# Patient Record
Sex: Male | Born: 1995
Health system: Southern US, Community
[De-identification: ages and names within clinical notes are randomized; demographics above are authoritative.]

## PROBLEM LIST (undated history)

## (undated) DIAGNOSIS — I10 Essential (primary) hypertension: Secondary | ICD-10-CM

## (undated) DIAGNOSIS — E079 Disorder of thyroid, unspecified: Secondary | ICD-10-CM

## (undated) HISTORY — DX: Essential (primary) hypertension: I10

## (undated) HISTORY — DX: Disorder of thyroid, unspecified: E07.9

---

## 2013-08-09 ENCOUNTER — Ambulatory Visit (INDEPENDENT_AMBULATORY_CARE_PROVIDER_SITE_OTHER): Payer: 59 | Admitting: Physician Assistant

## 2013-08-09 ENCOUNTER — Ambulatory Visit (INDEPENDENT_AMBULATORY_CARE_PROVIDER_SITE_OTHER): Payer: 59

## 2013-08-09 ENCOUNTER — Encounter: Payer: Self-pay | Admitting: Physician Assistant

## 2013-08-09 VITALS — BP 134/78 | HR 87 | Temp 97.9°F | Ht 68.0 in | Wt 168.0 lb

## 2013-08-09 DIAGNOSIS — Z720 Tobacco use: Secondary | ICD-10-CM | POA: Insufficient documentation

## 2013-08-09 DIAGNOSIS — R112 Nausea with vomiting, unspecified: Secondary | ICD-10-CM

## 2013-08-09 DIAGNOSIS — R109 Unspecified abdominal pain: Secondary | ICD-10-CM

## 2013-08-09 DIAGNOSIS — E039 Hypothyroidism, unspecified: Secondary | ICD-10-CM

## 2013-08-09 DIAGNOSIS — F17200 Nicotine dependence, unspecified, uncomplicated: Secondary | ICD-10-CM

## 2013-08-09 DIAGNOSIS — R1011 Right upper quadrant pain: Secondary | ICD-10-CM

## 2013-08-09 MED ORDER — VARENICLINE TARTRATE 0.5 MG X 11 & 1 MG X 42 PO MISC: ORAL | Status: DC

## 2013-08-09 MED ORDER — ONDANSETRON HCL 8 MG PO TABS: 8.0000 mg | ORAL_TABLET | Freq: Three times a day (TID) | ORAL | Status: DC | PRN

## 2013-08-09 NOTE — Progress Notes (Signed)
Subjective:    Patient ID: Devin Kim, male    DOB: [birth year], 18 y.o.   MRN: [id number]  HPI Patient is a 18 year old male who presents to clinic with ongoing nausea and vomiting. He wishes to establish care today. Patient has had 2 full weeks of vomiting after eating meals. He does not have any appetite. He describes some pressure in his upper abdomen but no sharp pains. She vomits mostly after meals but nauseous continually. His bowel movements have not really changed but if anything become cold but looser. He denies any blood in his stools and has bowel movements 2-3 times a day. He tried his sister Phenergan and it not found out. For the past couple of days he has gotten a little dizzy and lightheaded especially when trying to work out. Symptoms are definitely worse with greasy or foods. He denies any feelings of acid reflux. He does smoke daily and has a history of hypothyroidism. He has quit smoking wants him Chantix and would like to do it again. He denies any fever, chills, dysuria, back pain. He has not tried anything to make better. He has reported a 10 pound weight loss in the last month.  . Active Ambulatory Problems    Diagnosis Date Noted  . Hypothyroidism 08/09/2013  . Tobacco abuse 08/09/2013   Resolved Ambulatory Problems    Diagnosis Date Noted  . No Resolved Ambulatory Problems   Past Medical History  Diagnosis Date  . Thyroid disease   . Hypertension    . Family History  Problem Relation Age of Onset  . Alcohol abuse Father   . Hypertension Father   . Diabetes Paternal Uncle   . Cancer Paternal Grandmother   . Hypertension Paternal Grandfather    . History   Social History  . Marital Status: Unknown    Spouse Name: N/A    Number of Children: N/A  . Years of Education: N/A   Occupational History  . Not on file.   Social History Main Topics  . Smoking status: Current Every Day Smoker  . Smokeless tobacco: Not on file  . Alcohol Use: No  . Drug  Use: No  . Sexual Activity: Not Currently   Other Topics Concern  . Not on file   Social History Narrative  . No narrative on file      Review of Systems  All other systems reviewed and are negative.       Objective:   Physical Exam  Constitutional: He is oriented to person, place, and time. He appears well-developed and well-nourished.  HENT:  Head: Normocephalic and atraumatic.  Right Ear: External ear normal.  Left Ear: External ear normal.  Nose: Nose normal.  Mouth/Throat: Oropharynx is clear and moist.  Eyes: Conjunctivae are normal. Right eye exhibits no discharge. Left eye exhibits no discharge.  Neck: Normal range of motion. Neck supple.  Cardiovascular: Normal rate, regular rhythm and normal heart sounds.   Pulmonary/Chest: Effort normal and breath sounds normal. He has no wheezes.  Abdominal: Soft. Bowel sounds are normal. He exhibits no mass. There is no rebound and no guarding.  Tenderness over epigastric, left upper quadrant and right upper quadrant. Pain was more reproducible over right upper quadrant.  Lymphadenopathy:    He has no cervical adenopathy.  Neurological: He is alert and oriented to person, place, and time.  Skin: Skin is dry.  Psychiatric: He has a normal mood and affect. His behavior is normal.  Assessment & Plan:  Nausea/vomiting/right upper quadrant pain-symptoms sound very suspicious of gallbladder disease. Will get abdominal ultrasound today to rule out any gallbladder function. Discussed with patient that even if normal could still be some function loss of gallbladder and may need to do HIDA scan. I also gave patient excellent 60 mg to start daily. I would like to know of or samples run out if symptoms have improved. Patient was given Zofran to take for nausea. Discussed with patient 1118 fatty greasy foods and see if helps with overall nausea. Will also order CBC to check white count and CMP to check electrolytes.    Hypothyroidism-patient's TSH levels have not been checked in a while will recheck today.  Tobacco abuse-patient reports that Chantix has worked in the past and will give Rx for Chantix to start once he starts feeling better. Patient was encouraged to stop smoking is one of the best things he can do for his health.

## 2013-08-10 LAB — CBC WITH DIFFERENTIAL/PLATELET
Basophils Absolute: 0 10*3/uL (ref 0.0–0.1)
Basophils Relative: 1 % (ref 0–1)
Eosinophils Absolute: 0.2 10*3/uL (ref 0.0–0.7)
Eosinophils Relative: 3 % (ref 0–5)
HEMATOCRIT: 44.7 % (ref 39.0–52.0)
Hemoglobin: 14.8 g/dL (ref 13.0–17.0)
LYMPHS ABS: 2 10*3/uL (ref 0.7–4.0)
Lymphocytes Relative: 25 % (ref 12–46)
MCH: 28.6 pg (ref 26.0–34.0)
MCHC: 33.1 g/dL (ref 30.0–36.0)
MCV: 86.3 fL (ref 78.0–100.0)
MONO ABS: 0.7 10*3/uL (ref 0.1–1.0)
Monocytes Relative: 8 % (ref 3–12)
NEUTROS ABS: 5.1 10*3/uL (ref 1.7–7.7)
Neutrophils Relative %: 63 % (ref 43–77)
PLATELETS: 237 10*3/uL (ref 150–400)
RBC: 5.18 MIL/uL (ref 4.22–5.81)
RDW: 13.1 % (ref 11.5–15.5)
WBC: 8.1 10*3/uL (ref 4.0–10.5)

## 2013-08-10 LAB — COMPLETE METABOLIC PANEL WITH GFR
ALBUMIN: 4.7 g/dL (ref 3.5–5.2)
ALT: 25 U/L (ref 0–53)
AST: 22 U/L (ref 0–37)
Alkaline Phosphatase: 43 U/L (ref 39–117)
BUN: 13 mg/dL (ref 6–23)
CALCIUM: 9.7 mg/dL (ref 8.4–10.5)
CHLORIDE: 99 meq/L (ref 96–112)
CO2: 33 mEq/L — ABNORMAL HIGH (ref 19–32)
Creat: 0.9 mg/dL (ref 0.50–1.35)
GFR, Est African American: >89 mL/min
GFR, Est Non African American: >89 mL/min
Glucose, Bld: 81 mg/dL (ref 70–99)
POTASSIUM: 4.4 meq/L (ref 3.5–5.3)
Sodium: 144 mEq/L (ref 135–145)
Total Bilirubin: 0.4 mg/dL (ref 0.2–1.2)
Total Protein: 6.5 g/dL (ref 6.0–8.3)

## 2013-08-10 LAB — TSH: TSH: 4.707 u[IU]/mL — ABNORMAL HIGH (ref 0.350–4.500)

## 2013-08-12 ENCOUNTER — Other Ambulatory Visit: Payer: Self-pay | Admitting: Physician Assistant

## 2013-08-12 DIAGNOSIS — R112 Nausea with vomiting, unspecified: Secondary | ICD-10-CM | POA: Insufficient documentation

## 2013-08-12 DIAGNOSIS — R1011 Right upper quadrant pain: Secondary | ICD-10-CM

## 2013-08-12 MED ORDER — LEVOTHYROXINE SODIUM 75 MCG PO TABS: 75.0000 ug | ORAL_TABLET | Freq: Every day | ORAL | Status: DC

## 2013-08-12 NOTE — Patient Instructions (Signed)
Will get RUQ ultrasound.  STart dexilant daily.

## 2013-08-20 ENCOUNTER — Telehealth: Payer: Self-pay | Admitting: *Deleted

## 2013-08-20 NOTE — Telephone Encounter (Signed)
PA obtained for NM Hepatobiliary. Auth # [id number].  Meyer CoryMisty Ahmad, LPN

## 2013-08-30 ENCOUNTER — Ambulatory Visit (INDEPENDENT_AMBULATORY_CARE_PROVIDER_SITE_OTHER): Payer: BC Managed Care – PPO | Admitting: Physician Assistant

## 2013-08-30 ENCOUNTER — Encounter: Payer: Self-pay | Admitting: Physician Assistant

## 2013-08-30 VITALS — BP 136/72 | HR 97 | Wt 167.0 lb

## 2013-08-30 DIAGNOSIS — R569 Unspecified convulsions: Secondary | ICD-10-CM

## 2013-08-30 DIAGNOSIS — K649 Unspecified hemorrhoids: Secondary | ICD-10-CM

## 2013-08-30 DIAGNOSIS — Z13220 Encounter for screening for lipoid disorders: Secondary | ICD-10-CM

## 2013-08-30 DIAGNOSIS — K921 Melena: Secondary | ICD-10-CM

## 2013-08-30 MED ORDER — VARENICLINE TARTRATE 0.5 MG X 11 & 1 MG X 42 PO MISC: ORAL | Status: DC

## 2013-08-30 MED ORDER — HYDROCORTISONE ACETATE 25 MG RE SUPP: 25.0000 mg | Freq: Two times a day (BID) | RECTAL | Status: DC

## 2013-08-30 NOTE — Progress Notes (Signed)
   Subjective:    Patient ID: Devin Kim, male    DOB: 11/06/1995, 18 y.o.   MRN: [id number]  HPI Patient is a 18 year old male who presents to the clinic with blood in stool for 2 weeks. He has had this issue before and were hemorrhoids. They seemed to resolve on their own. The last time he had hemorrhoids or from heavy lifting. He denies any heavy lifting but admits to straining with bowel movements. He has increased his water intake to help this. He reports a bowel movement every other day but they are harder than usual. Denies any abdominal pain or back pain. It is always bright red blood in stool not tarry black stools. Denies any fever, chills. Patient does have some ongoing nausea and vomiting and right upper quadrant tenderness. He does have the HIDA scan scheduled for next week.  Patient didn't want me to be aware that he did have a seizure approximately 3 weeks ago. His seizures are typically tonic-clonic. He has an appointment next week to be evaluated by neurologist Crystal rose PA. He has been placed out of work until he has his neurologist evaluation.  He never started Chantix before. His previous insurance company would not pay for Chantix. He would like another prescription today.    Review of Systems     Objective:   Physical Exam  Constitutional: He is oriented to person, place, and time. He appears well-developed and well-nourished.  HENT:  Head: Normocephalic and atraumatic.  Cardiovascular: Normal rate, regular rhythm and normal heart sounds.   Pulmonary/Chest: Effort normal and breath sounds normal.  Genitourinary: Rectum normal.  No external hemorrhoids were seen. I did palpate one to 2 small masses at the base of the anal sphincter just right inside. Hemoccult was negative for blood.  Neurological: He is alert and oriented to person, place, and time.  Skin: Skin is dry.  Psychiatric: He has a normal mood and affect. His behavior is normal.          Assessment  & Plan:  Hemorrhoid/blood in stool-discuss with patient that no external hemorrhoids were viewed. I did palpate what I feel like were internal hemorrhoids at the base of sphincter. Hemoccult in office today was negative for blood. Will treat with Anusol as needed for hemorrhoids. Discuss warm sitz bath. Encouraged patient to increase fiber in diet and consider stool softener. Gave patient handout on other symptomatic treatment of hemorrhoids. It feels like pain is not resolving or symptoms not resolving please followup.  Nausea vomiting/right upper quadrant pain- Will evaluate with HIDA scan   Tobacco cessation-patient was pronounced another copy of Chantix to see if this insurance company will pay.  Patient needs screening cholesterol. Lab slip was given.   Seizure-patient is being managed by Crystal rose PA/neurologist. Encouraged patient not to drive until evaluated by neurologist.

## 2013-08-30 NOTE — Patient Instructions (Addendum)
Colace-stool softener/high fiber diet  Hemorrhoids Hemorrhoids are swollen veins around the rectum or anus. There are two types of hemorrhoids:   Internal hemorrhoids. These occur in the veins just inside the rectum. They may poke through to the outside and become irritated and painful.  External hemorrhoids. These occur in the veins outside the anus and can be felt as a painful swelling or hard lump near the anus. CAUSES  Pregnancy.   Obesity.   Constipation or diarrhea.   Straining to have a bowel movement.   Sitting for long periods on the toilet.  Heavy lifting or other activity that caused you to strain.  Anal intercourse. SYMPTOMS   Pain.   Anal itching or irritation.   Rectal bleeding.   Fecal leakage.   Anal swelling.   One or more lumps around the anus.  DIAGNOSIS  Your caregiver may be able to diagnose hemorrhoids by visual examination. Other examinations or tests that may be performed include:   Examination of the rectal area with a gloved hand (digital rectal exam).   Examination of anal canal using a small tube (scope).   A blood test if you have lost a significant amount of blood.  A test to look inside the colon (sigmoidoscopy or colonoscopy). TREATMENT Most hemorrhoids can be treated at home. However, if symptoms do not seem to be getting better or if you have a lot of rectal bleeding, your caregiver may perform a procedure to help make the hemorrhoids get smaller or remove them completely. Possible treatments include:   Placing a rubber band at the base of the hemorrhoid to cut off the circulation (rubber band ligation).   Injecting a chemical to shrink the hemorrhoid (sclerotherapy).   Using a tool to burn the hemorrhoid (infrared light therapy).   Surgically removing the hemorrhoid (hemorrhoidectomy).   Stapling the hemorrhoid to block blood flow to the tissue (hemorrhoid stapling).  HOME CARE INSTRUCTIONS   Eat foods  with fiber, such as whole grains, beans, nuts, fruits, and vegetables. Ask your doctor about taking products with added fiber in them (fibersupplements).  Increase fluid intake. Drink enough water and fluids to keep your urine clear or pale yellow.   Exercise regularly.   Go to the bathroom when you have the urge to have a bowel movement. Do not wait.   Avoid straining to have bowel movements.   Keep the anal area dry and clean. Use wet toilet paper or moist towelettes after a bowel movement.   Medicated creams and suppositories may be used or applied as directed.   Only take over-the-counter or prescription medicines as directed by your caregiver.   Take warm sitz baths for 15 20 minutes, 3 4 times a day to ease pain and discomfort.   Place ice packs on the hemorrhoids if they are tender and swollen. Using ice packs between sitz baths may be helpful.   Put ice in a plastic bag.   Place a towel between your skin and the bag.   Leave the ice on for 15 20 minutes, 3 4 times a day.   Do not use a donut-shaped pillow or sit on the toilet for long periods. This increases blood pooling and pain.  SEEK MEDICAL CARE IF:  You have increasing pain and swelling that is not controlled by treatment or medicine.  You have uncontrolled bleeding.  You have difficulty or you are unable to have a bowel movement.  You have pain or inflammation outside the area of  the hemorrhoids. MAKE SURE YOU:  Understand these instructions.  Will watch your condition.  Will get help right away if you are not doing well or get worse. Document Released: 06/10/2000 Document Revised: 05/30/2012 Document Reviewed: 04/17/2012 [facility] LPExit[facility] Patient Information 2014 [place], [place].

## 2013-09-02 ENCOUNTER — Other Ambulatory Visit: Payer: Self-pay | Admitting: Physician Assistant

## 2013-09-02 ENCOUNTER — Encounter (HOSPITAL_COMMUNITY)
Admission: RE | Admit: 2013-09-02 | Discharge: 2013-09-02 | Disposition: A | Discharge status: Home / Self Care | Payer: BC Managed Care – PPO | Source: Ambulatory Visit | Attending: Physician Assistant | Admitting: Physician Assistant

## 2013-09-02 DIAGNOSIS — R1011 Right upper quadrant pain: Secondary | ICD-10-CM

## 2013-09-02 DIAGNOSIS — R112 Nausea with vomiting, unspecified: Secondary | ICD-10-CM

## 2013-09-02 MED ORDER — SINCALIDE 5 MCG IJ SOLR
0.0200 ug/kg | Freq: Once | INTRAMUSCULAR | Status: AC
  Administered 2013-09-02: 1.52 ug via INTRAVENOUS

## 2013-09-02 MED ORDER — STERILE WATER FOR INJECTION IJ SOLN
INTRAMUSCULAR | Status: AC
  Filled 2013-09-02: qty 10

## 2013-09-02 MED ORDER — TECHNETIUM TC 99M MEBROFENIN IV KIT
5.0000 | PACK | Freq: Once | INTRAVENOUS | Status: AC | PRN
  Administered 2013-09-02: 5 via INTRAVENOUS

## 2013-09-02 MED ORDER — SINCALIDE 5 MCG IJ SOLR
INTRAMUSCULAR | Status: AC
  Filled 2013-09-02: qty 5

## 2013-09-04 ENCOUNTER — Other Ambulatory Visit: Payer: Self-pay | Admitting: Physician Assistant

## 2013-09-04 DIAGNOSIS — R112 Nausea with vomiting, unspecified: Secondary | ICD-10-CM

## 2013-09-04 DIAGNOSIS — K649 Unspecified hemorrhoids: Secondary | ICD-10-CM

## 2013-09-04 DIAGNOSIS — K921 Melena: Secondary | ICD-10-CM

## 2013-10-21 ENCOUNTER — Encounter: Payer: Self-pay | Admitting: Physician Assistant

## 2013-10-21 DIAGNOSIS — K602 Anal fissure, unspecified: Secondary | ICD-10-CM | POA: Insufficient documentation

## 2013-10-30 ENCOUNTER — Telehealth: Payer: Self-pay | Admitting: *Deleted

## 2013-10-30 NOTE — Telephone Encounter (Signed)
Pt calls stating that he is having a ton of rectal pain.  He has been calling [facility] for 2 days & no one is calling him back. He states that his pain keeps getting worse & worse.  I advised him that he should probably go ahead and go to ED since you were are out of the office until Friday.  Just FYI.

## 2013-10-30 NOTE — Telephone Encounter (Signed)
Yes go to ED and needs follow up with digestive [facility].

## 2013-10-30 NOTE — Telephone Encounter (Signed)
Can call and see if they gave him in nitroglycerin cream to go rectally. I could send this in for him to try for muscle spasms of anus.

## 2013-11-04 ENCOUNTER — Telehealth: Payer: Self-pay | Admitting: *Deleted

## 2013-11-04 NOTE — Telephone Encounter (Signed)
Ok for continuing pak no refills. Needs follow up.

## 2013-11-04 NOTE — Telephone Encounter (Signed)
Spoke with pt & he is going to call me back with the name of the medication that GI had given him.  ED didn't do anything for him & told him to f/u with GI.

## 2013-11-04 NOTE — Telephone Encounter (Signed)
Pt would like to know if you could send over the continuing pack for chantix.

## 2013-11-05 MED ORDER — VARENICLINE TARTRATE 1 MG PO TABS: 1.0000 mg | ORAL_TABLET | Freq: Two times a day (BID) | ORAL | Status: DC

## 2013-11-05 NOTE — Telephone Encounter (Signed)
rx sent & LMOM notifying pt. 

## 2013-11-22 ENCOUNTER — Ambulatory Visit (INDEPENDENT_AMBULATORY_CARE_PROVIDER_SITE_OTHER): Payer: BC Managed Care – PPO | Admitting: Physician Assistant

## 2013-11-22 ENCOUNTER — Encounter: Payer: Self-pay | Admitting: Physician Assistant

## 2013-11-22 VITALS — BP 135/79 | HR 90 | Ht 68.0 in | Wt 167.0 lb

## 2013-11-22 DIAGNOSIS — F17200 Nicotine dependence, unspecified, uncomplicated: Secondary | ICD-10-CM

## 2013-11-22 DIAGNOSIS — Z7251 High risk heterosexual behavior: Secondary | ICD-10-CM

## 2013-11-22 DIAGNOSIS — R21 Rash and other nonspecific skin eruption: Secondary | ICD-10-CM

## 2013-11-22 DIAGNOSIS — J029 Acute pharyngitis, unspecified: Secondary | ICD-10-CM

## 2013-11-22 DIAGNOSIS — Z720 Tobacco use: Secondary | ICD-10-CM | POA: Insufficient documentation

## 2013-11-22 DIAGNOSIS — E039 Hypothyroidism, unspecified: Secondary | ICD-10-CM

## 2013-11-22 LAB — POCT RAPID STREP A (OFFICE): Rapid Strep A Screen: NEGATIVE

## 2013-11-22 MED ORDER — VARENICLINE TARTRATE 1 MG PO TABS: 1.0000 mg | ORAL_TABLET | Freq: Two times a day (BID) | ORAL | Status: DC

## 2013-11-22 MED ORDER — CLONAZEPAM 0.5 MG PO TABS: 0.5000 mg | ORAL_TABLET | Freq: Two times a day (BID) | ORAL | Status: DC | PRN

## 2013-11-23 LAB — HIV ANTIBODY (ROUTINE TESTING W REFLEX): HIV: NONREACTIVE

## 2013-11-23 LAB — CBC WITH DIFFERENTIAL/PLATELET
Basophils Absolute: 0 10*3/uL (ref 0.0–0.1)
Basophils Relative: 0 % (ref 0–1)
Eosinophils Absolute: 0.1 10*3/uL (ref 0.0–0.7)
Eosinophils Relative: 1 % (ref 0–5)
HCT: 41.6 % (ref 39.0–52.0)
HEMOGLOBIN: 14.5 g/dL (ref 13.0–17.0)
LYMPHS ABS: 1.6 10*3/uL (ref 0.7–4.0)
LYMPHS PCT: 21 % (ref 12–46)
MCH: 28.4 pg (ref 26.0–34.0)
MCHC: 34.9 g/dL (ref 30.0–36.0)
MCV: 81.6 fL (ref 78.0–100.0)
MONOS PCT: 11 % (ref 3–12)
Monocytes Absolute: 0.8 10*3/uL (ref 0.1–1.0)
Neutro Abs: 5 10*3/uL (ref 1.7–7.7)
Neutrophils Relative %: 67 % (ref 43–77)
PLATELETS: 227 10*3/uL (ref 150–400)
RBC: 5.1 MIL/uL (ref 4.22–5.81)
RDW: 13 % (ref 11.5–15.5)
WBC: 7.4 10*3/uL (ref 4.0–10.5)

## 2013-11-23 LAB — TSH: TSH: 5.745 u[IU]/mL — AB (ref 0.350–4.500)

## 2013-11-23 LAB — RPR TITER

## 2013-11-23 LAB — T4, FREE: Free T4: 1.37 ng/dL (ref 0.80–1.80)

## 2013-11-23 LAB — RPR: RPR: REACTIVE — AB

## 2013-11-25 ENCOUNTER — Other Ambulatory Visit: Payer: Self-pay | Admitting: Physician Assistant

## 2013-11-25 ENCOUNTER — Encounter: Payer: Self-pay | Admitting: Physician Assistant

## 2013-11-25 DIAGNOSIS — A539 Syphilis, unspecified: Secondary | ICD-10-CM | POA: Insufficient documentation

## 2013-11-25 LAB — HSV(HERPES SMPLX)ABS-I+II(IGG+IGM)-BLD
HSV 2 Glycoprotein G Ab, IgG: 0.26 IV
Herpes Simplex Vrs I&II-IgM Ab (EIA): 0.99 INDEX

## 2013-11-25 LAB — T.PALLIDUM AB, TOTAL

## 2013-11-25 MED ORDER — PREDNISONE 20 MG PO TABS: ORAL_TABLET | ORAL | Status: DC

## 2013-11-25 NOTE — Progress Notes (Addendum)
   Subjective:    Patient ID: Devin Kim, male    DOB: 1995-11-08, 18 y.o.   MRN: [id number]  HPI Pt  is a 18 yo male who presents to the clinic with a 2 week non-itchy, non-painful rash for 2 weeks. Noticed on his fore arms first and has spread to his legs. No fever, chills, n/v/d. He does wish to be STD tested. He has not been checked since being with his son's mother about 6 months ago. He has had at least one other sexual partner which was around 3 months ago. No penile lesions or discharge. he denies any fatigue. Does have scratchy throat.    Hypothyroidism- not checked in a while. Taking levothyroxine daily.   Tobacco abuse- stopped smoking since march 2015. On chantix needs refill.    Review of Systems  All other systems reviewed and are negative.      Objective:   Physical Exam  Constitutional: He is oriented to person, place, and time. He appears well-developed and well-nourished.  HENT:  Head: Normocephalic and atraumatic.  Right Ear: External ear normal.  Left Ear: External ear normal.  Nose: Nose normal.  Mouth/Throat: Oropharynx is clear and moist.  Eyes: Conjunctivae are normal. Right eye exhibits no discharge. Left eye exhibits no discharge.  Neck: Normal range of motion. Neck supple.  Cardiovascular: Normal rate, regular rhythm and normal heart sounds.   Pulmonary/Chest: Effort normal and breath sounds normal.  Lymphadenopathy:    He has no cervical adenopathy.  Neurological: He is oriented to person, place, and time.  Skin:  Macular erythematous rash on arms, trunk, legs. No ulceration, no vesicles.   Psychiatric: He has a normal mood and affect. His behavior is normal.          Assessment & Plan:  Rash/high risk sexual behavior- unclear etiology. Rapid strep negative. Will culture. No symptoms to treat. Will get CBC. Could be viral. Will get STD panel due to high risk and rash. Will call with results.   Tobacco abuse- using chantix. Refilled for another  month.   Hypothyroidism- will recheck labs and refill according.

## 2013-11-26 ENCOUNTER — Telehealth: Payer: Self-pay

## 2013-11-26 LAB — GC/CHLAMYDIA PROBE AMP, URINE
Chlamydia, Swab/Urine, PCR: NEGATIVE
GC Probe Amp, Urine: NEGATIVE

## 2013-11-26 NOTE — Telephone Encounter (Signed)
Due to positive RPR I scheduled patient for a nurse visit to receive his PCN injection. See result notes.

## 2013-11-27 ENCOUNTER — Ambulatory Visit (INDEPENDENT_AMBULATORY_CARE_PROVIDER_SITE_OTHER): Payer: BC Managed Care – PPO | Admitting: Physician Assistant

## 2013-11-27 VITALS — BP 138/78 | HR 76 | Temp 98.5°F

## 2013-11-27 DIAGNOSIS — A539 Syphilis, unspecified: Secondary | ICD-10-CM

## 2013-11-27 LAB — CULTURE, GROUP A STREP: ORGANISM ID, BACTERIA: NORMAL

## 2013-11-27 MED ORDER — PENICILLIN G BENZATHINE 1200000 UNIT/2ML IM SUSP
2.4000 10*6.[IU] | Freq: Once | INTRAMUSCULAR | Status: AC
  Administered 2013-11-27: 2.4 10*6.[IU] via INTRAMUSCULAR

## 2013-11-27 NOTE — Progress Notes (Signed)
Pt here to get Syphilis tx. He was given a total of 2.4 units of Bicillin L-A in two separate injections of 1.2/ .  The first was given in his LUOQ pt tolerated well. 2nd injection given without any problems also tolerated well. After injections were given pt was asked to wait for 15 mins after injections to watch for any reaction. Pt reports that Tamika from the health dept is coming to his home tomorrow [phone]).Deno Etienne

## 2013-12-04 ENCOUNTER — Ambulatory Visit: Payer: BC Managed Care – PPO | Admitting: *Deleted

## 2013-12-04 ENCOUNTER — Telehealth: Payer: Self-pay | Admitting: *Deleted

## 2013-12-04 NOTE — Telephone Encounter (Signed)
The patient was in the office today on the nurse visit schedule for an injection of penicillin since he tested positive for syphilis. Per Tamika who is familiar with his case and works at the health department, the infection should have cleared with just one injection. Tamika states the guidelines for treatment state  if a patient has had syphilis for a year or more(meaning they test positive without any signs or symptoms) then they would need three rounds of 2.4 units of Bicillin. Tamika states in this patients case one injection of the 2.4 units should clear his infection. He presented earlier in the office with a rash that had  only been present for two weeks.She states the patient would need to be recheck for syphilis in three months then again in 6 months. Advised patient of the guidelines and made him aware to mark his calender to recheck this in three months and again in 6 months

## 2014-07-31 IMAGING — NM NM HEPATO W/GB/PHARM/[PERSON_NAME]
2 series · 12 of 12 positions shown · non-contrast
Comparison: None.

CLINICAL DATA: Abdominal pain with nausea and vomiting

EXAM:
NUCLEAR MEDICINE HEPATOBILIARY IMAGING WITH GALLBLADDER EF
Views:  Anterior, right lateral right upper quadrant
Radionuclide: Technetium 99m Choletec
Dose:  5.0 mCi
Route of administration:  Intravenous

[he hepatobiliary · 3.43mm/px · 6 of 56 frames shown (1 of 2)]
[frame 5/56]
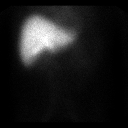
[frame 14/56]
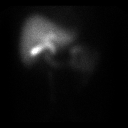
[frame 24/56]
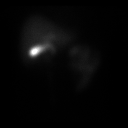
[frame 33/56]
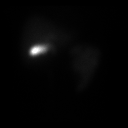
[frame 42/56]
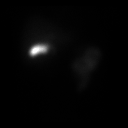
[frame 52/56]
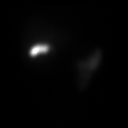

[he hepatobiliary · 3.43mm/px · 6 of 30 frames shown (2 of 2)]
[frame 3/30]
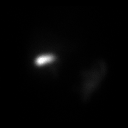
[frame 8/30]
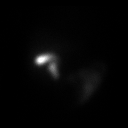
[frame 13/30]
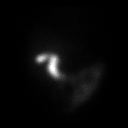
[frame 18/30]
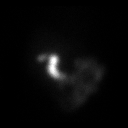
[frame 23/30]
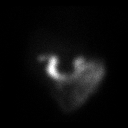
[frame 28/30]
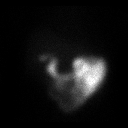

[12 of 12 positions shown; findings below may reference images not displayed]

FINDINGS: Liver uptake of radiotracer is normal. There is prompt visualization
of gallbladder and small bowel, indicating patency of the cystic and
common bile ducts.

A weight based dose, 1.5 mcg, of CCK was administered intravenously
with calculation of the computer generated ejection fraction of
radiotracer from the gallbladder. The patient did experience pain
during the CCK infusion. The computer generated ejection fraction of
radiotracer from the gallbladder is normal at 96.4%, normal greater
than 38%.
IMPRESSION: Normal ejection fraction of radiotracer from the gallbladder. Note
that the patient did experience pain during the CCK infusion. Cystic
and common bile ducts are patent as is evidenced by visualization of
gallbladder and small bowel.

## 2014-08-27 ENCOUNTER — Ambulatory Visit (INDEPENDENT_AMBULATORY_CARE_PROVIDER_SITE_OTHER): Payer: BLUE CROSS/BLUE SHIELD | Admitting: Physician Assistant

## 2014-08-27 ENCOUNTER — Encounter: Payer: Self-pay | Admitting: Physician Assistant

## 2014-08-27 VITALS — BP 145/79 | HR 98 | Wt 176.0 lb

## 2014-08-27 DIAGNOSIS — F411 Generalized anxiety disorder: Secondary | ICD-10-CM

## 2014-08-27 DIAGNOSIS — E039 Hypothyroidism, unspecified: Secondary | ICD-10-CM

## 2014-08-27 DIAGNOSIS — Z7251 High risk heterosexual behavior: Secondary | ICD-10-CM

## 2014-08-27 DIAGNOSIS — I10 Essential (primary) hypertension: Secondary | ICD-10-CM | POA: Insufficient documentation

## 2014-08-27 MED ORDER — LISINOPRIL 10 MG PO TABS: 10.0000 mg | ORAL_TABLET | Freq: Every day | ORAL | Status: DC

## 2014-08-27 MED ORDER — LEVOTHYROXINE SODIUM 75 MCG PO TABS: 75.0000 ug | ORAL_TABLET | Freq: Every day | ORAL | Status: DC

## 2014-08-27 MED ORDER — CITALOPRAM HYDROBROMIDE 10 MG PO TABS: 10.0000 mg | ORAL_TABLET | Freq: Every day | ORAL | Status: DC

## 2014-08-27 MED ORDER — CLONAZEPAM 0.5 MG PO TABS: 0.5000 mg | ORAL_TABLET | Freq: Two times a day (BID) | ORAL | Status: DC | PRN

## 2014-08-27 NOTE — Progress Notes (Signed)
   Subjective:    Patient ID: Devin Kim, male    DOB: 03/24/1996, 19 y.o.   MRN: [id number]  HPI Pt presents to the clinic to restart medication. He has been out of insurance for a while and needs to restart.   He does feel like anxiety is out of control. He has a good job but constiantly feels anxious and like it is effecting his performance. Not on anything. klonapin has helped in past with anxiety.   Not on any of his normal medications.   Hx of STD wants to make sure clean today. No symptoms.    Review of Systems  All other systems reviewed and are negative.      Objective:   Physical Exam  Constitutional: He is oriented to person, place, and time. He appears well-developed and well-nourished.  HENT:  Head: Normocephalic and atraumatic.  Cardiovascular: Normal rate, regular rhythm and normal heart sounds.   Pulmonary/Chest: Effort normal and breath sounds normal.  Neurological: He is alert and oriented to person, place, and time.  Skin: Skin is dry.  Psychiatric: He has a normal mood and affect. His behavior is normal.          Assessment & Plan:  GAD- GAD-7 was 12. Could have something to do with hypothyroidism out of control. Will start celexa 10mg  at bedtime. klonapin only as needed. Discussed abuse potential. Side effects discussed. Follow up in 4-6 weeks.   Hypothyroidism- start back on levothyroxine and recheck in 4-6 weeks.   HTN- elevated start on lisinopril 10mg  daily. Discussed side effects. Recheck in 4 weeks.   High risk sexual behavior/hx of sphyillis- STD panel done today.

## 2014-08-28 LAB — GC/CHLAMYDIA PROBE AMP, URINE
Chlamydia, Swab/Urine, PCR: NEGATIVE
GC Probe Amp, Urine: NEGATIVE

## 2014-08-28 LAB — RPR

## 2014-08-28 LAB — HIV ANTIBODY (ROUTINE TESTING W REFLEX): HIV: NONREACTIVE

## 2015-08-05 ENCOUNTER — Other Ambulatory Visit: Payer: Self-pay | Admitting: Physician Assistant

## 2015-08-06 NOTE — Telephone Encounter (Signed)
I called patient to schedule a f/u appt Monday 08-10-15 for med refills with Lesly Rubenstein

## 2015-08-10 ENCOUNTER — Ambulatory Visit (INDEPENDENT_AMBULATORY_CARE_PROVIDER_SITE_OTHER): Payer: BLUE CROSS/BLUE SHIELD | Admitting: Physician Assistant

## 2015-08-10 ENCOUNTER — Encounter: Payer: Self-pay | Admitting: Physician Assistant

## 2015-08-10 ENCOUNTER — Other Ambulatory Visit: Payer: Self-pay | Admitting: Physician Assistant

## 2015-08-10 VITALS — BP 145/53 | HR 75 | Ht 68.0 in | Wt 198.0 lb

## 2015-08-10 DIAGNOSIS — E039 Hypothyroidism, unspecified: Secondary | ICD-10-CM | POA: Diagnosis not present

## 2015-08-10 DIAGNOSIS — Z13220 Encounter for screening for lipoid disorders: Secondary | ICD-10-CM

## 2015-08-10 DIAGNOSIS — J029 Acute pharyngitis, unspecified: Secondary | ICD-10-CM

## 2015-08-10 DIAGNOSIS — Z113 Encounter for screening for infections with a predominantly sexual mode of transmission: Secondary | ICD-10-CM | POA: Diagnosis not present

## 2015-08-10 DIAGNOSIS — I10 Essential (primary) hypertension: Secondary | ICD-10-CM

## 2015-08-10 DIAGNOSIS — Z79899 Other long term (current) drug therapy: Secondary | ICD-10-CM

## 2015-08-10 DIAGNOSIS — F17201 Nicotine dependence, unspecified, in remission: Secondary | ICD-10-CM

## 2015-08-10 DIAGNOSIS — F411 Generalized anxiety disorder: Secondary | ICD-10-CM

## 2015-08-10 MED ORDER — CITALOPRAM HYDROBROMIDE 10 MG PO TABS: 10.0000 mg | ORAL_TABLET | Freq: Every day | ORAL | Status: DC

## 2015-08-10 MED ORDER — LISINOPRIL 10 MG PO TABS: 10.0000 mg | ORAL_TABLET | Freq: Every day | ORAL | Status: DC

## 2015-08-10 MED ORDER — CLONAZEPAM 0.5 MG PO TABS: 0.5000 mg | ORAL_TABLET | Freq: Two times a day (BID) | ORAL | Status: DC | PRN

## 2015-08-10 NOTE — Patient Instructions (Signed)

## 2015-08-10 NOTE — Progress Notes (Addendum)
   Subjective:    Patient ID: Devin Kim, male    DOB: 21-Feb-1996, 20 y.o.   MRN: [id number]  HPI  Patient is a 20 year old male presenting for medication follow up. Additional concerns for this visit include obtaining a STD screening for gonorrhea, chlamydia, HIV and syphillis. Patient states that he has been taking levothyroxine as directed for the past 2-3 months and feels more energetic and positive. Patient has not been taking lisinopril as directed for hypertension. Patient states that he would like to be restarted on citalopram for generalized anxiety and have his klonopin refilled to be used PRN. Patient states that he would like to increase the dose of Klonopin as he has experienced "memory loss" at the lower dose. Patient states that he has experienced a sore throat since yesterday. However, this is not atypical for him as he recently traveled to [place], which usually exacerbates his allergies. Patient denies fever, abdominal pain, headache and cough.   Review of Systems   Please see HPI Objective:   Physical Exam  Constitutional: He is oriented to person, place, and time. He appears well-developed and well-nourished.  HENT:  Head: Normocephalic and atraumatic.  Nose: Nose normal.  Patient's pharynx is erythematous.Tonsil hypertrophy +1 visualized on the right.   Patient's tympanic membranes are pearly with bony landmarks visible and without erythema bilaterally.    Eyes: Conjunctivae and EOM are normal. Pupils are equal, round, and reactive to light. Right eye exhibits no discharge.  Neck: Normal range of motion.  Patient has mild thyroid fullness.   Cardiovascular: Normal rate, regular rhythm and normal heart sounds.   No murmur heard. Pulmonary/Chest: Effort normal and breath sounds normal. No respiratory distress. He has no wheezes. He has no rales. He exhibits no tenderness.  Abdominal: Soft. Bowel sounds are normal. He exhibits no distension and no mass. There is no  tenderness. There is no rebound and no guarding.  Neurological: He is alert and oriented to person, place, and time. No cranial nerve deficit.  Skin: Skin is warm and dry. No rash noted. No erythema. No pallor.  Psychiatric: He has a normal mood and affect. His behavior is normal. Judgment and thought content normal.      Assessment & Plan:   1. STD screening Patient has a history of risky sexual behavior. Patient will undergo screening for HIV, chlamydia, gonorrhea, and syphilis.   2. Essential Hypertension  Patient's most recent blood pressure is 150/90. Patient states that he has been non-compliant with lisinopril. Patient will be restarted on Lisinopril ( ). Patient will also undergo CMP.  3. Hypothyroidism  Patient will undergo TSH and T3/T4 testing. Patient will resume therapy with levothyroxine, dosage adjustments pending lab results.   4. Health Screening Patient will undergo lipid profile.   5. Sore throat- rapid strep negative. Discussed viral etiology. Discussed symptomatic care.   6. Anxiety/depression- restarted celexa and klonapin as needed. disussed as needed klonapin. Follow up in 4-6 weeks.

## 2015-08-11 ENCOUNTER — Encounter: Payer: Self-pay | Admitting: Physician Assistant

## 2015-08-11 LAB — COMPLETE METABOLIC PANEL WITH GFR
ALBUMIN: 4.7 g/dL (ref 3.6–5.1)
ALT: 22 U/L (ref 9–46)
AST: 25 U/L (ref 10–40)
Alkaline Phosphatase: 54 U/L (ref 40–115)
BUN: 16 mg/dL (ref 7–25)
CALCIUM: 9.7 mg/dL (ref 8.6–10.3)
CO2: 22 mmol/L (ref 20–31)
Chloride: 104 mmol/L (ref 98–110)
Creat: 0.97 mg/dL (ref 0.60–1.35)
GFR, Est African American: >89 mL/min (ref >=60)
GFR, Est Non African American: >89 mL/min (ref >=60)
GLUCOSE: 79 mg/dL (ref 65–99)
POTASSIUM: 3.8 mmol/L (ref 3.5–5.3)
SODIUM: 141 mmol/L (ref 135–146)
Total Bilirubin: 0.7 mg/dL (ref 0.2–1.2)
Total Protein: 7.2 g/dL (ref 6.1–8.1)

## 2015-08-11 LAB — LIPID PANEL
CHOL/HDL RATIO: 5 ratio (ref <=5.0)
Cholesterol: 195 mg/dL (ref 125–200)
HDL: 39 mg/dL — ABNORMAL LOW (ref >=40)
LDL Cholesterol: 131 mg/dL — ABNORMAL HIGH (ref <130)
Triglycerides: 126 mg/dL (ref <150)
VLDL: 25 mg/dL (ref <30)

## 2015-08-11 LAB — TSH: TSH: 3 mIU/L (ref 0.40–4.50)

## 2015-08-11 LAB — HIV ANTIBODY (ROUTINE TESTING W REFLEX): HIV 1&2 Ab, 4th Generation: NONREACTIVE

## 2015-08-11 LAB — T3, FREE: T3 FREE: 3.1 pg/mL (ref 2.3–4.2)

## 2015-08-11 LAB — T4, FREE: Free T4: 1.4 ng/dL (ref 0.8–1.8)

## 2015-08-11 NOTE — Addendum Note (Signed)
Addended byTandy Gaw L on: 08/11/2015 09:00 AM   Modules accepted: Level of Service

## 2015-08-12 LAB — RPR

## 2015-08-13 ENCOUNTER — Other Ambulatory Visit: Payer: Self-pay | Admitting: *Deleted

## 2015-08-13 LAB — HSV(HERPES SMPLX)ABS-I+II(IGG+IGM)-BLD
HSV 2 Glycoprotein G Ab, IgG: <0.1 IV
Herpes Simplex Vrs I&II-IgM Ab (EIA): 0.73 INDEX

## 2015-08-13 LAB — GC/CHLAMYDIA PROBE AMP
CT Probe RNA: NOT DETECTED
GC Probe RNA: NOT DETECTED

## 2015-08-13 MED ORDER — LEVOTHYROXINE SODIUM 75 MCG PO TABS: ORAL_TABLET | ORAL | Status: DC

## 2016-08-26 ENCOUNTER — Ambulatory Visit (INDEPENDENT_AMBULATORY_CARE_PROVIDER_SITE_OTHER): Payer: 59 | Admitting: Physician Assistant

## 2016-08-26 ENCOUNTER — Encounter: Payer: Self-pay | Admitting: Physician Assistant

## 2016-08-26 ENCOUNTER — Other Ambulatory Visit: Payer: Self-pay | Admitting: Physician Assistant

## 2016-08-26 VITALS — BP 154/101 | HR 92 | Ht 68.0 in | Wt 209.0 lb

## 2016-08-26 DIAGNOSIS — Z7253 High risk bisexual behavior: Secondary | ICD-10-CM | POA: Insufficient documentation

## 2016-08-26 DIAGNOSIS — N529 Male erectile dysfunction, unspecified: Secondary | ICD-10-CM

## 2016-08-26 DIAGNOSIS — R51 Headache: Secondary | ICD-10-CM

## 2016-08-26 DIAGNOSIS — E039 Hypothyroidism, unspecified: Secondary | ICD-10-CM | POA: Diagnosis not present

## 2016-08-26 DIAGNOSIS — R519 Headache, unspecified: Secondary | ICD-10-CM

## 2016-08-26 DIAGNOSIS — I10 Essential (primary) hypertension: Secondary | ICD-10-CM | POA: Diagnosis not present

## 2016-08-26 DIAGNOSIS — Z0000 Encounter for general adult medical examination without abnormal findings: Secondary | ICD-10-CM

## 2016-08-26 DIAGNOSIS — Z23 Encounter for immunization: Secondary | ICD-10-CM

## 2016-08-26 LAB — CBC WITH DIFFERENTIAL/PLATELET
Basophils Absolute: 0 cells/uL (ref 0–200)
Basophils Relative: 0 %
EOS ABS: 107 {cells}/uL (ref 15–500)
EOS PCT: 1 %
HCT: 43 % (ref 38.5–50.0)
HEMOGLOBIN: 14.5 g/dL (ref 13.2–17.1)
Lymphocytes Relative: 21 %
Lymphs Abs: 2247 cells/uL (ref 850–3900)
MCH: 29.8 pg (ref 27.0–33.0)
MCHC: 33.7 g/dL (ref 32.0–36.0)
MCV: 88.3 fL (ref 80.0–100.0)
MPV: 11.5 fL (ref 7.5–12.5)
Monocytes Absolute: 963 cells/uL — ABNORMAL HIGH (ref 200–950)
Monocytes Relative: 9 %
NEUTROS PCT: 69 %
Neutro Abs: 7383 cells/uL (ref 1500–7800)
Platelets: 255 10*3/uL (ref 140–400)
RBC: 4.87 MIL/uL (ref 4.20–5.80)
RDW: 13.3 % (ref 11.0–15.0)
WBC: 10.7 10*3/uL (ref 3.8–10.8)

## 2016-08-26 MED ORDER — SILDENAFIL CITRATE 100 MG PO TABS: 100.0000 mg | ORAL_TABLET | ORAL | 0 refills | Status: DC | PRN

## 2016-08-26 MED ORDER — LISINOPRIL 10 MG PO TABS: 10.0000 mg | ORAL_TABLET | Freq: Every day | ORAL | 0 refills | Status: DC

## 2016-08-26 NOTE — Patient Instructions (Addendum)
Erectile Dysfunction Erectile dysfunction (ED) is the inability to get or keep an erection in order to have sexual intercourse. Erectile dysfunction may include:  Inability to get an erection.  Lack of enough hardness of the erection to allow penetration.  Loss of the erection before sex is finished. What are the causes? This condition may be caused by:  Certain medicines, such as:  Pain relievers.  Antihistamines.  Antidepressants.  Blood pressure medicines.  Water pills (diuretics).  Ulcer medicines.  Muscle relaxants.  Drugs.  Excessive drinking.  Psychological causes, such as:  Anxiety.  Depression.  Sadness.  Exhaustion.  Performance fear.  Stress.  Physical causes, such as:  Artery problems. This may include diabetes, smoking, liver disease, or atherosclerosis.  High blood pressure.  Hormonal problems, such as low testosterone.  Obesity.  Nerve problems. This may include back or pelvic injuries, diabetes mellitus, multiple sclerosis, or Parkinson disease. What are the signs or symptoms? Symptoms of this condition include:  Inability to get an erection.  Lack of enough hardness of the erection to allow penetration.  Loss of the erection before sex is finished.  Normal erections at some times, but with frequent unsatisfactory episodes.  Low sexual satisfaction in either partner due to erection problems.  A curved penis occurring with erection. The curve may cause pain or the penis may be too curved to allow for intercourse.  Never having nighttime erections. How is this diagnosed? This condition is often diagnosed by:  Performing a physical exam to find other diseases or specific problems with the penis.  Asking you detailed questions about the problem.  Performing blood tests to check for diabetes mellitus or to measure hormone levels.  Performing other tests to check for underlying health conditions.  Performing an ultrasound  exam to check for scarring.  Performing a test to check blood flow to the penis.  Doing a sleep study at home to measure nighttime erections. How is this treated? This condition may be treated by:  Medicine taken by mouth to help you achieve an erection (oral medicine).  Hormone replacement therapy to replace low testosterone levels.  Medicine that is injected into the penis. Your health care provider may instruct you how to give yourself these injections at home.  Vacuum pump. This is a pump with a ring on it. The pump and ring are placed on the penis and used to create pressure that helps the penis become erect.  Penile implant surgery. In this procedure, you may receive:  An inflatable implant. This consists of cylinders, a pump, and a reservoir. The cylinders can be inflated with a fluid that helps to create an erection, and they can be deflated after intercourse.  A semi-rigid implant. This consists of two silicone rubber rods. The rods provide some rigidity. They are also flexible, so the penis can both curve downward in its normal position and become straight for sexual intercourse.  Blood vessel surgery, to improve blood flow to the penis. During this procedure, a blood vessel from a different part of the body is placed into the penis to allow blood to flow around (bypass) damaged or blocked blood vessels.  Lifestyle changes, such as exercising more, losing weight, and quitting smoking. Follow these instructions at home: Medicines   Take over-the-counter and prescription medicines only as told by your health care provider. Do not increase the dosage without first discussing it with your health care provider.  If you are using self-injections, perform injections as directed by your   health care provider. Make sure to avoid any veins that are on the surface of the penis. After giving an injection, apply pressure to the injection site for 5 minutes. General instructions    Exercise regularly, as directed by your health care provider. Work with your health care provider to lose weight, if needed.  Do not use any products that contain nicotine or tobacco, such as cigarettes and e-cigarettes. If you need help quitting, ask your health care provider.  Before using a vacuum pump, read the instructions that come with the pump and discuss any questions with your health care provider.  Keep all follow-up visits as told by your health care provider. This is important. Contact a health care provider if:  You feel nauseous.  You vomit. Get help right away if:  You are taking oral or injectable medicines and you have an erection that lasts longer than 4 hours. If your health care provider is unavailable, go to the nearest emergency room for evaluation. An erection that lasts much longer than 4 hours can result in permanent damage to your penis.  You have severe pain in your groin or abdomen.  You develop redness or severe swelling of your penis.  You have redness spreading up into your groin or lower abdomen.  You are unable to urinate.  You experience chest pain or a rapid heart beat (palpitations) after taking oral medicines. Summary  Erectile dysfunction (ED) is the inability to get or keep an erection during sexual intercourse. This problem can usually be treated successfully.  This condition is diagnosed based on a physical exam, your symptoms, and tests to determine the cause. Treatment varies depending on the cause, and may include medicines, hormone therapy, surgery, or vacuum pump.  You may need follow-up visits to make sure that you are using your medicines or devices correctly.  Get help right away if you are taking or injecting medicines and you have an erection that lasts longer than 4 hours. This information is not intended to replace advice given to you by your health care provider. Make sure you discuss any questions you have with your health  care provider. Document Released: 06/10/2000 Document Revised: 06/29/2016 Document Reviewed: 06/29/2016 Elsevier Interactive Patient Education  2017 [employer].  Keeping you healthy  Get these tests  Blood pressure- Have your blood pressure checked once a year by your healthcare provider.  Normal blood pressure is 120/80.  Weight- Have your body mass index (BMI) calculated to screen for obesity.  BMI is a measure of body fat based on height and weight. You can also calculate your own BMI at https://www.west-esparza.com/.  Cholesterol- Have your cholesterol checked regularly starting at age 31, sooner may be necessary if you have diabetes, high blood pressure, if a family member developed heart diseases at an early age or if you smoke.   Chlamydia, HIV, and other sexual transmitted disease- Get screened each year until the age of 41 then within three months of each new sexual partner.  Diabetes- Have your blood sugar checked regularly if you have high blood pressure, high cholesterol, a family history of diabetes or if you are overweight.  Get these vaccines  Flu shot- Every fall.  Tetanus shot- Every 10 years.  Menactra- Single dose; prevents meningitis.  Take these steps  Don't smoke- If you do smoke, ask your healthcare provider about quitting. For tips on how to quit, go to www.smokefree.gov or call 1-800-QUIT-NOW.  Be physically active- Exercise 5 days a week  for at least 30 minutes.  If you are not already physically active start slow and gradually work up to 30 minutes of moderate physical activity.  Examples of moderate activity include walking briskly, mowing the yard, dancing, swimming bicycling, etc.  Eat a healthy diet- Eat a variety of healthy foods such as fruits, vegetables, low fat milk, low fat cheese, yogurt, lean meats, poultry, fish, beans, tofu, etc.  For more information on healthy eating, go to www.thenutritionsource.org  Drink alcohol in moderation- Limit  alcohol intake two drinks or less a day.  Never drink and drive.  Dentist- Brush and floss teeth twice daily; visit your dentis twice a year.  Depression-Your emotional health is as important as your physical health.  If you're feeling down, losing interest in things you normally enjoy please talk with your healthcare provider.  Gun Safety- If you keep a gun in your home, keep it unloaded and with the safety lock on.  Bullets should be stored separately.  Helmet use- Always wear a helmet when riding a motorcycle, bicycle, rollerblading or skateboarding.  Safe sex- If you may be exposed to a sexually transmitted infection, use a condom  Seat belts- Seat bels can save your life; always wear one.  Smoke/Carbon Monoxide detectors- These detectors need to be installed on the appropriate level of your home.  Replace batteries at least once a year.  Skin Cancer- When out in the sun, cover up and use sunscreen SPF 15 or higher.  Violence- If anyone is threatening or hurting you, please tell your healthcare provider.

## 2016-08-26 NOTE — Progress Notes (Addendum)
Subjective:     Patient ID: Devin Kim, male   DOB: [birth year], 21 y.o.   MRN: [id number]  HPI  Patient's blood pressure was 156/101 today. Patient is prescribed lisinopril 10 mg however states he often forgets to take it and did not take it this morning. Does not regularly check his BP. Has family history of extremely high blood pressure and states his dad's blood pressure is generally 240/160. He denies CP, palpitations, headaches, dizziness.   Patient also takes Levothyroxine 75 mcg almost every morning. States he notices a very positive difference when he takes the medication. Today, he complains of fatigue. Denies difficulty sleeping and depressed mood.   Also complains of headaches 1-2 times a month. Headache is across the entire forehead and is pulsating. Rates pain as a 6-7/10 or 8-9/10 if pulsatile. Endorses photophobia, phonophobia during headache. Unsure of visual changes because he states "I close my eyes or go in a dark room." No nausea. States occasionally pain starts in his neck. Made better by Advil and Aleve.   Pt is sexually active and admits to being bi-sexual. He has noticed difficultly in maintaining erection for last few months. This has made him very self concious and would like to know why.   Social History   Social History  . Marital status: Single    Spouse name: N/A  . Number of children: N/A  . Years of education: N/A   Social History Main Topics  . Smoking status: Former Smoker    Start date: 08/26/2013  . Smokeless tobacco: Never Used  . Alcohol use No  . Drug use: No  . Sexual activity: Not Currently   Other Topics Concern  . None   Social History Narrative  . None    The follow portions of the patient's history were reviewed and updated: past medical history, problem list, medication list, allergies, family history, social history.   Review of Systems See HPI.    Objective:   Physical Exam  Constitutional: He is oriented to person, place, and  time. He appears well-developed and well-nourished.  HENT:  Head: Normocephalic and atraumatic.  Right Ear: External ear normal.  Left Ear: External ear normal.  Mouth/Throat: Oropharynx is clear and moist. No oropharyngeal exudate.  Eyes: Conjunctivae and EOM are normal. Pupils are equal, round, and reactive to light. Right eye exhibits no discharge. Left eye exhibits no discharge.  Neck: Normal range of motion. Neck supple. No JVD present. No thyromegaly present.  Cardiovascular: Normal rate and regular rhythm.  Exam reveals no gallop and no friction rub.   No murmur heard. Pulmonary/Chest: Effort normal. He has no wheezes. He has no rales.  Abdominal: Soft. Bowel sounds are normal. He exhibits no distension and no mass. There is no tenderness. There is no rebound and no guarding.  Musculoskeletal: Normal range of motion. He exhibits no edema, tenderness or deformity.  Lymphadenopathy:    He has no cervical adenopathy.  Neurological: He is alert and oriented to person, place, and time. He has normal reflexes. Coordination normal.  Skin: Skin is warm and dry. No rash noted.  Psychiatric: He has a normal mood and affect. His behavior is normal.      Assessment/Plan:  Diagnoses and all orders for this visit:  Essential hypertension, benign -     COMPLETE METABOLIC PANEL WITH GFR -     CBC with Differential -     lisinopril (PRINIVIL,ZESTRIL) 10 MG tablet; Take 1 tablet (10 mg total) by mouth  daily.  Hypothyroidism, unspecified type -     TSH  High risk bisexual behavior -     GC/chlamydia probe amp, urine -     HIV antibody (with reflex) -     RPR -     Cancel: GC/Chlamydia Probe Amp -     GC/Chlamydia Probe Amp  Erectile dysfunction, unspecified erectile dysfunction type -     sildenafil (VIAGRA) 100 MG tablet; Take 1 tablet (100 mg total) by mouth as needed for erectile dysfunction (for use prior to sexual activity). -     Testosterone  Need for Tdap vaccination -     Tdap  vaccine greater than or equal to 7yo IM  Frequent headaches  discussed causes of ED. HO given. I feel like due to uncontrolled BP. Start lisinopril and recheck in 4 weeks. If controlled will send over refills. Reassured lisinopril is a great BP medication NOT to cause ED. viagra given with coupon card. Side effects discussed. Start with one/half tablet.   Will adjust thyroid medication as needed.   Discussed aleve/excedrin migraine. Encouraged tension relaxation exercises. Follow up as needed for further discussion. No red flags today.

## 2016-08-27 ENCOUNTER — Other Ambulatory Visit: Payer: Self-pay | Admitting: Physician Assistant

## 2016-08-27 LAB — COMPLETE METABOLIC PANEL WITH GFR
ALK PHOS: 52 U/L (ref 40–115)
ALT: 25 U/L (ref 9–46)
AST: 20 U/L (ref 10–40)
Albumin: 4.6 g/dL (ref 3.6–5.1)
BILIRUBIN TOTAL: 0.4 mg/dL (ref 0.2–1.2)
BUN: 12 mg/dL (ref 7–25)
CO2: 24 mmol/L (ref 20–31)
Calcium: 9.6 mg/dL (ref 8.6–10.3)
Chloride: 105 mmol/L (ref 98–110)
Creat: 1.04 mg/dL (ref 0.60–1.35)
GFR, Est African American: >89 mL/min (ref >=60)
GFR, Est Non African American: >89 mL/min (ref >=60)
GLUCOSE: 87 mg/dL (ref 65–99)
POTASSIUM: 3.8 mmol/L (ref 3.5–5.3)
SODIUM: 140 mmol/L (ref 135–146)
TOTAL PROTEIN: 7 g/dL (ref 6.1–8.1)

## 2016-08-27 LAB — TESTOSTERONE: TESTOSTERONE: 459 ng/dL (ref 250–827)

## 2016-08-27 LAB — GC/CHLAMYDIA PROBE AMP
CT PROBE, AMP APTIMA: NOT DETECTED
GC Probe RNA: NOT DETECTED

## 2016-08-27 LAB — TSH: TSH: 1.94 m[IU]/L (ref 0.40–4.50)

## 2016-08-27 LAB — HIV ANTIBODY (ROUTINE TESTING W REFLEX): HIV: NONREACTIVE

## 2016-08-27 LAB — RPR

## 2016-08-29 ENCOUNTER — Other Ambulatory Visit: Payer: Self-pay | Admitting: Physician Assistant

## 2016-08-29 MED ORDER — SILDENAFIL CITRATE 20 MG PO TABS: ORAL_TABLET | ORAL | 5 refills | Status: DC

## 2016-09-01 ENCOUNTER — Telehealth: Payer: Self-pay | Admitting: Physician Assistant

## 2016-09-01 NOTE — Telephone Encounter (Signed)
Received fax for prior authorization on Viagra sent through cover my meds and waiting on response. - CF

## 2016-10-05 ENCOUNTER — Ambulatory Visit (INDEPENDENT_AMBULATORY_CARE_PROVIDER_SITE_OTHER): Payer: 59 | Admitting: Physician Assistant

## 2016-10-05 ENCOUNTER — Encounter: Payer: Self-pay | Admitting: Physician Assistant

## 2016-10-05 VITALS — BP 132/74 | HR 90 | Ht 68.0 in | Wt 208.0 lb

## 2016-10-05 DIAGNOSIS — N529 Male erectile dysfunction, unspecified: Secondary | ICD-10-CM

## 2016-10-05 DIAGNOSIS — I10 Essential (primary) hypertension: Secondary | ICD-10-CM

## 2016-10-05 MED ORDER — TADALAFIL 20 MG PO TABS: 20.0000 mg | ORAL_TABLET | Freq: Every day | ORAL | 0 refills | Status: DC | PRN

## 2016-10-05 MED ORDER — LISINOPRIL 10 MG PO TABS: 10.0000 mg | ORAL_TABLET | Freq: Every day | ORAL | 11 refills | Status: DC

## 2016-10-06 LAB — BASIC METABOLIC PANEL
BUN: 23 mg/dL (ref 7–25)
CHLORIDE: 102 mmol/L (ref 98–110)
CO2: 25 mmol/L (ref 20–31)
Calcium: 9.4 mg/dL (ref 8.6–10.3)
Creat: 1.1 mg/dL (ref 0.60–1.35)
Glucose, Bld: 88 mg/dL (ref 65–99)
POTASSIUM: 4.2 mmol/L (ref 3.5–5.3)
SODIUM: 137 mmol/L (ref 135–146)

## 2016-10-06 LAB — PSA: PSA: 0.4 ng/mL (ref <=4.0)

## 2016-10-06 NOTE — Telephone Encounter (Signed)
According to cover my meds medication was denied. - CF

## 2016-10-07 NOTE — Progress Notes (Signed)
   Subjective:    Patient ID: Devin Kim, male    DOB: [birth year], 21 y.o.   MRN: [id number]  HPI Pt is a 21 yo male who presents to the clinic for BP follow up. He is taking his lisinopril daily. No headaches, SOB, CP, dizziness, or vision changes.   ED-viagra was too expensive. Revatio did not seem to help him get an erection. He took up to 4 tablets with no benefit. He denies and weak stream, dysuria, leakage, increase in urinary frequency.    Review of Systems  All other systems reviewed and are negative.      Objective:   Physical Exam  Constitutional: He is oriented to person, place, and time. He appears well-developed and well-nourished.  HENT:  Head: Normocephalic and atraumatic.  Cardiovascular: Normal rate, regular rhythm and normal heart sounds.   Pulmonary/Chest: Effort normal and breath sounds normal.  Neurological: He is alert and oriented to person, place, and time.  Psychiatric: He has a normal mood and affect. His behavior is normal.          Assessment & Plan:  Marland KitchenMarland KitchenKalonji was seen today for hypertension.  Diagnoses and all orders for this visit:  Erectile dysfunction, unspecified erectile dysfunction type -     tadalafil (CIALIS) 20 MG tablet; Take 1 tablet (20 mg total) by mouth daily as needed for erectile dysfunction. -     PSA  Essential hypertension, benign -     lisinopril (PRINIVIL,ZESTRIL) 10 MG tablet; Take 1 tablet (10 mg total) by mouth daily. -     Basic metabolic panel   Pt declined DRE today. He does not have any prostate symptoms. Will check PSA in blood.  Will try cialis for ED. rx printed.  I feel like there could be some psychological processing issues going on. Encouraged sex therapy.  CMP ordered for medication management on lisinopril.  BP on 2nd recheck looks great.  Medication refilled for one year.  I

## 2017-03-22 ENCOUNTER — Encounter: Payer: Self-pay | Admitting: Physician Assistant

## 2017-03-22 ENCOUNTER — Ambulatory Visit (INDEPENDENT_AMBULATORY_CARE_PROVIDER_SITE_OTHER): Payer: BLUE CROSS/BLUE SHIELD | Admitting: Physician Assistant

## 2017-03-22 VITALS — BP 144/78 | HR 75 | Wt 208.0 lb

## 2017-03-22 DIAGNOSIS — K645 Perianal venous thrombosis: Secondary | ICD-10-CM

## 2017-03-22 MED ORDER — LIDOCAINE-HYDROCORTISONE ACE 3-1 % RE KIT: 1.0000 "application " | PACK | Freq: Two times a day (BID) | RECTAL | 2 refills | Status: DC

## 2017-03-22 MED ORDER — HYDROCORTISONE 2.5 % EX OINT: TOPICAL_OINTMENT | CUTANEOUS | 1 refills | Status: DC

## 2017-03-22 MED ORDER — HYDROCODONE-ACETAMINOPHEN 5-325 MG PO TABS: 1.0000 | ORAL_TABLET | Freq: Three times a day (TID) | ORAL | 0 refills | Status: DC | PRN

## 2017-03-22 NOTE — Patient Instructions (Signed)
Hemorrhoids Hemorrhoids are swollen veins in and around the rectum or anus. There are two types of hemorrhoids:  Internal hemorrhoids. These occur in the veins that are just inside the rectum. They may poke through to the outside and become irritated and painful.  External hemorrhoids. These occur in the veins that are outside of the anus and can be felt as a painful swelling or hard lump near the anus.  Most hemorrhoids do not cause serious problems, and they can be managed with home treatments such as diet and lifestyle changes. If home treatments do not help your symptoms, procedures can be done to shrink or remove the hemorrhoids. What are the causes? This condition is caused by increased pressure in the anal area. This pressure may result from various things, including:  Constipation.  Straining to have a bowel movement.  Diarrhea.  Pregnancy.  Obesity.  Sitting for long periods of time.  Heavy lifting or other activity that causes you to strain.  Anal sex.  What are the signs or symptoms? Symptoms of this condition include:  Pain.  Anal itching or irritation.  Rectal bleeding.  Leakage of stool (feces).  Anal swelling.  One or more lumps around the anus.  How is this diagnosed? This condition can often be diagnosed through a visual exam. Other exams or tests may also be done, such as:  Examination of the rectal area with a gloved hand (digital rectal exam).  Examination of the anal canal using a small tube (anoscope).  A blood test, if you have lost a significant amount of blood.  A test to look inside the colon (sigmoidoscopy or colonoscopy).  How is this treated? This condition can usually be treated at home. However, various procedures may be done if dietary changes, lifestyle changes, and other home treatments do not help your symptoms. These procedures can help make the hemorrhoids smaller or remove them completely. Some of these procedures involve  surgery, and others do not. Common procedures include:  Rubber band ligation. Rubber bands are placed at the base of the hemorrhoids to cut off the blood supply to them.  Sclerotherapy. Medicine is injected into the hemorrhoids to shrink them.  Infrared coagulation. A type of light energy is used to get rid of the hemorrhoids.  Hemorrhoidectomy surgery. The hemorrhoids are surgically removed, and the veins that supply them are tied off.  Stapled hemorrhoidopexy surgery. A circular stapling device is used to remove the hemorrhoids and use staples to cut off the blood supply to them.  Follow these instructions at home: Eating and drinking  Eat foods that have a lot of fiber in them, such as whole grains, beans, nuts, fruits, and vegetables. Ask your health care provider about taking products that have added fiber (fiber supplements).  Drink enough fluid to keep your urine clear or pale yellow. Managing pain and swelling  Take warm sitz baths for 20 minutes, 3-4 times a day to ease pain and discomfort.  If directed, apply ice to the affected area. Using ice packs between sitz baths may be helpful. ? Put ice in a plastic bag. ? Place a towel between your skin and the bag. ? Leave the ice on for 20 minutes, 2-3 times a day. General instructions  Take over-the-counter and prescription medicines only as told by your health care provider.  Use medicated creams or suppositories as told.  Exercise regularly.  Go to the bathroom when you have the urge to have a bowel movement. Do not wait.    Avoid straining to have bowel movements.  Keep the anal area dry and clean. Use wet toilet paper or moist towelettes after a bowel movement.  Do not sit on the toilet for long periods of time. This increases blood pooling and pain. Contact a health care provider if:  You have increasing pain and swelling that are not controlled by treatment or medicine.  You have uncontrolled bleeding.  You  have difficulty having a bowel movement, or you are unable to have a bowel movement.  You have pain or inflammation outside the area of the hemorrhoids. This information is not intended to replace advice given to you by your health care provider. Make sure you discuss any questions you have with your health care provider. Document Released: 06/10/2000 Document Revised: 11/11/2015 Document Reviewed: 02/25/2015 Elsevier Interactive Patient Education  2017 [employer]  How to Take a Sitz Bath A sitz bath is a warm water bath that is taken while you are sitting down. The water should only come up to your hips and should cover your buttocks. Your health care provider may recommend a sitz bath to help you:  Clean the lower part of your body, including your genital area.  With itching.  With pain.  With sore muscles or muscles that tighten or spasm.  How to take a sitz bath Take 3-4 sitz baths per day or as told by your health care provider. 1. Partially fill a bathtub with warm water. You will only need the water to be deep enough to cover your hips and buttocks when you are sitting in it. 2. If your health care provider told you to put medicine in the water, follow the directions exactly. 3. Sit in the water and open the tub drain a little. 4. Turn on the warm water again to keep the tub at the correct level. Keep the water running constantly. 5. Soak in the water for 15-20 minutes or as told by your health care provider. 6. After the sitz bath, pat the affected area dry first. Do not rub it. 7. Be careful when you stand up after the sitz bath because you may feel dizzy.  Contact a health care provider if:  Your symptoms get worse. Do not continue with sitz baths if your symptoms get worse.  You have new symptoms. Do not continue with sitz baths until you talk with your health care provider. This information is not intended to replace advice given to you by your health care provider.  Make sure you discuss any questions you have with your health care provider. Document Released: 03/05/2004 Document Revised: 11/11/2015 Document Reviewed: 06/11/2014 Elsevier Interactive Patient Education  2018 [employer]  

## 2017-03-23 ENCOUNTER — Encounter: Payer: Self-pay | Admitting: Physician Assistant

## 2017-03-24 DIAGNOSIS — K645 Perianal venous thrombosis: Secondary | ICD-10-CM | POA: Insufficient documentation

## 2017-03-24 NOTE — Progress Notes (Signed)
   Subjective:    Patient ID: Devin Kim, male    DOB: 1995-07-04, 21 y.o.   MRN: [id number]  HPI Pt is a 21 yo male who presents to the clinic with painful hemorrhoid for last 3 days. Pt has a hx of hemorrhoids. He has never had to have any procedures to relieve hemorrhoid pain. He is using OTC hemorrhoid cream. He admits to have some harder stools with bright red blood. Rates pain 8/10.   .. Active Ambulatory Problems    Diagnosis Date Noted  . Hypothyroidism 08/09/2013  . Right upper quadrant pain 08/12/2013  . Nausea with vomiting 08/12/2013  . Seizures ([facility]) 08/30/2013  . Anal fissure 10/21/2013  . Syphilis 11/25/2013  . Essential hypertension, benign 08/27/2014  . GAD (generalized anxiety disorder) 08/27/2014  . High risk sexual behavior 08/27/2014  . Tobacco dependence in remission 08/10/2015  . High risk bisexual behavior 08/26/2016  . Erectile dysfunction 08/26/2016  . Frequent headaches 08/26/2016  . Thrombosed hemorrhoids 03/24/2017   Resolved Ambulatory Problems    Diagnosis Date Noted  . Tobacco abuse 08/09/2013  . Tobacco abuse 11/22/2013   Past Medical History:  Diagnosis Date  . Hypertension   . Thyroid disease       Review of Systems  All other systems reviewed and are negative.      Objective:   Physical Exam  Constitutional: He is oriented to person, place, and time. He appears well-developed and well-nourished.  HENT:  Head: Normocephalic and atraumatic.  Cardiovascular: Normal rate, regular rhythm and normal heart sounds.   Genitourinary:     Neurological: He is alert and oriented to person, place, and time.  Psychiatric: He has a normal mood and affect. His behavior is normal.          Assessment & Plan:  Marland KitchenMarland KitchenNainoa was seen today for piles.  Diagnoses and all orders for this visit:  Thrombosed hemorrhoids -     HYDROcodone-acetaminophen (NORCO/VICODIN) 5-325 MG tablet; Take 1 tablet by mouth every 8 (eight) hours as needed for  moderate pain. -     lidocaine-hydrocortisone (ANAMANTLE) 3-1 % KIT; Place 1 application rectally 2 (two) times daily. -     hydrocortisone 2.5 % ointment; Apply to affected area BID.  Other orders -     Discontinue: hydrocortisone 2.5 % ointment; Apply to affected area BID.  Incision Thrombosed Hemorrhoid Procedure Note  Pre-operative Diagnosis: Thrombosed external hemorrhoid  Post-operative Diagnosis: Thrombosed external hemorrhoid  Locations:left side anus  Indications: Painful external hemorrhoid. Explained surgical vs conservative treatment; surgical incision and drainage of clot usually works quickly to reduce pain and shorten healing time, but is uncomfortable to perform.  After discussion, the patient wishes to proceed with this procedure.  Anesthesia: Lidocaine 1% without epinephrine without added sodium bicarbonate  Procedure Details  After adequate anesthesia, an elliptical incision was made, and the visible clot was extruded with curved hemostat. This was well tolerated. Bulky dressing is applied.  Complications: none.  Plan: 1. Very frequent Sitz baths. 2. Colace tid prn and increase fluids to prevent constipation.   3. Call or return to clinic prn if these symptoms worsen or fail to improve as anticipated

## 2017-04-07 ENCOUNTER — Other Ambulatory Visit: Payer: Self-pay | Admitting: *Deleted

## 2017-04-07 MED ORDER — LEVOTHYROXINE SODIUM 75 MCG PO TABS: ORAL_TABLET | ORAL | 0 refills | Status: DC

## 2017-05-31 ENCOUNTER — Encounter: Payer: Self-pay | Admitting: Physician Assistant

## 2017-05-31 ENCOUNTER — Ambulatory Visit (INDEPENDENT_AMBULATORY_CARE_PROVIDER_SITE_OTHER): Payer: BLUE CROSS/BLUE SHIELD | Admitting: Physician Assistant

## 2017-05-31 VITALS — BP 143/83 | HR 138 | Temp 98.6°F | Ht 68.0 in | Wt 205.0 lb

## 2017-05-31 DIAGNOSIS — R52 Pain, unspecified: Secondary | ICD-10-CM | POA: Diagnosis not present

## 2017-05-31 DIAGNOSIS — J111 Influenza due to unidentified influenza virus with other respiratory manifestations: Secondary | ICD-10-CM

## 2017-05-31 DIAGNOSIS — R509 Fever, unspecified: Secondary | ICD-10-CM | POA: Diagnosis not present

## 2017-05-31 DIAGNOSIS — R69 Illness, unspecified: Secondary | ICD-10-CM

## 2017-05-31 DIAGNOSIS — R000 Tachycardia, unspecified: Secondary | ICD-10-CM

## 2017-05-31 LAB — POCT INFLUENZA A/B
Influenza A, POC: NEGATIVE
Influenza B, POC: NEGATIVE

## 2017-05-31 MED ORDER — OSELTAMIVIR PHOSPHATE 75 MG PO CAPS
75.0000 mg | ORAL_CAPSULE | Freq: Two times a day (BID) | ORAL | 0 refills | Status: DC
Start: 2017-05-31 — End: 2017-11-22

## 2017-05-31 NOTE — Patient Instructions (Signed)
STay away from decongestants.  Influenza, Adult Influenza ("the flu") is an infection in the lungs, nose, and throat (respiratory tract). It is caused by a virus. The flu causes many common cold symptoms, as well as a high fever and body aches. It can make you feel very sick. The flu spreads easily from person to person (is contagious). Getting a flu shot (influenza vaccination) every year is the best way to prevent the flu. Follow these instructions at home:  Take over-the-counter and prescription medicines only as told by your doctor.  Use a cool mist humidifier to add moisture (humidity) to the air in your home. This can make it easier to breathe.  Rest as needed.  Drink enough fluid to keep your pee (urine) clear or pale yellow.  Cover your mouth and nose when you cough or sneeze.  Wash your hands with soap and water often, especially after you cough or sneeze. If you cannot use soap and water, use hand sanitizer.  Stay home from work or school as told by your doctor. Unless you are visiting your doctor, try to avoid leaving home until your fever has been gone for 24 hours without the use of medicine.  Keep all follow-up visits as told by your doctor. This is important. How is this prevented?  Getting a yearly (annual) flu shot is the best way to avoid getting the flu. You may get the flu shot in late summer, fall, or winter. Ask your doctor when you should get your flu shot.  Wash your hands often or use hand sanitizer often.  Avoid contact with people who are sick during cold and flu season.  Eat healthy foods.  Drink plenty of fluids.  Get enough sleep.  Exercise regularly. Contact a doctor if:  You get new symptoms.  You have: ? Chest pain. ? Watery poop (diarrhea). ? A fever.  Your cough gets worse.  You start to have more mucus.  You feel sick to your stomach (nauseous).  You throw up (vomit). Get help right away if:  You start to be short of breath or  have trouble breathing.  Your skin or nails turn a bluish color.  You have very bad pain or stiffness in your neck.  You get a sudden headache.  You get sudden pain in your face or ear.  You cannot stop throwing up. This information is not intended to replace advice given to you by your health care provider. Make sure you discuss any questions you have with your health care provider. Document Released: 03/22/2008 Document Revised: 11/19/2015 Document Reviewed: 04/07/2015 Elsevier Interactive Patient Education  2017 [employer]

## 2017-05-31 NOTE — Progress Notes (Addendum)
Subjective:    Patient ID: Devin Kim, male    DOB: 01/13/1996, 21 y.o.   MRN: [id number]  HPI Patient is a 21 y/o male who presents today with fever and cough.   His son was sick with similar symptoms over the weekend. He reports having allergies over the weekend, however on Tuesday he developed congestion, post nasal drip, non-productive cough, headache, myalgias, feverish, chills, fatigue and shortness of breath on exertion. He has took Advil Cold & Sinus yesterday with minimal relief. He reports fever of 100.5-101F this morning, however in office it was 98.64F. He denies chest pain, palpitations, sore throat, chest tightness, wheezing, nausea, vomiting, or diarrhea. His HR and BP are elevated today at 138 bpm and 143/83 mmHg. He denies illicit drug use or taking medications containing decongestants.  .. Active Ambulatory Problems    Diagnosis Date Noted  . Hypothyroidism 08/09/2013  . Right upper quadrant pain 08/12/2013  . Nausea with vomiting 08/12/2013  . Seizures ([facility]) 08/30/2013  . Anal fissure 10/21/2013  . Syphilis 11/25/2013  . Essential hypertension, benign 08/27/2014  . GAD (generalized anxiety disorder) 08/27/2014  . High risk sexual behavior 08/27/2014  . Tobacco dependence in remission 08/10/2015  . High risk bisexual behavior 08/26/2016  . Erectile dysfunction 08/26/2016  . Frequent headaches 08/26/2016  . Thrombosed hemorrhoids 03/24/2017  . Tachycardia determined by examination of pulse 06/01/2017   Resolved Ambulatory Problems    Diagnosis Date Noted  . Tobacco abuse 08/09/2013  . Tobacco abuse 11/22/2013   Past Medical History:  Diagnosis Date  . Hypertension   . Thyroid disease      Review of Systems See HPI. All other systems reviewed are negative.    Objective:   Physical Exam  Constitutional: He is oriented to person, place, and time. He appears well-developed and well-nourished.  HENT:  Head: Normocephalic and atraumatic.  Right Ear: No  tenderness. Tympanic membrane is erythematous.  Left Ear: No tenderness. Tympanic membrane is erythematous.  Cardiovascular: Normal rate, regular rhythm and normal heart sounds.  Pulmonary/Chest: Effort normal and breath sounds normal. He has no wheezes. He has no rales.  Lymphadenopathy:    He has no cervical adenopathy.  Neurological: He is alert and oriented to person, place, and time.  Skin: Skin is warm and dry.  Psychiatric: He has a normal mood and affect. His behavior is normal. Thought content normal.  Vitals reviewed.     Assessment & Plan:  .Marland Kitchen.Marland Kitchen.Devin Kim was seen today for fever, cough and nasal congestion.  Diagnoses and all orders for this visit:  Influenza-like illness -     oseltamivir (TAMIFLU) 75 MG capsule; Take 1 capsule (75 mg total) by mouth 2 (two) times daily. For 5 days.  Tachycardia determined by examination of pulse -     EKG 12-Lead  Body aches -     POCT Influenza A/B  Fever, unspecified fever cause -     POCT Influenza A/B   Influenza like illness - POCT Influenza A/B negative, however based on the patients fever, body aches, severity of symptoms and short duration of symptoms. Provided patient with Tamiflu prescription. Advised patient that he can continue to use OTC cold and flu medications for symptomatic relief including Tylenol, mucinex, zinc lozenges, and vitamin C supplements. Advised patient to stay away from any OTC medications with decongestants due to his tachycardia.  Tachycardia - Patients HR was elevated at 138 bpm, however he denies chest pain or palpitations. ECG showed sinus tachycardia but no  acute abnormalities. Suspect tachycardia is related to the patients fever and flu-like illness. Continue to hydrate. He is not having any volume losses and I do not think he needs IV fluids today.  Advised patient to stay away from OTC cold and flu medications that contain decongestants as they can cause an elevation in HR and BP. Advised patient that  if his symptoms worsen or he begins to experience chest pain or palpitations to return for further reevaluation.

## 2017-06-01 DIAGNOSIS — R000 Tachycardia, unspecified: Secondary | ICD-10-CM | POA: Insufficient documentation

## 2017-06-30 DIAGNOSIS — M530 Cervicocranial syndrome: Secondary | ICD-10-CM | POA: Diagnosis not present

## 2017-06-30 DIAGNOSIS — M9902 Segmental and somatic dysfunction of thoracic region: Secondary | ICD-10-CM | POA: Diagnosis not present

## 2017-06-30 DIAGNOSIS — M9903 Segmental and somatic dysfunction of lumbar region: Secondary | ICD-10-CM | POA: Diagnosis not present

## 2017-06-30 DIAGNOSIS — M9901 Segmental and somatic dysfunction of cervical region: Secondary | ICD-10-CM | POA: Diagnosis not present

## 2017-07-10 DIAGNOSIS — M9901 Segmental and somatic dysfunction of cervical region: Secondary | ICD-10-CM | POA: Diagnosis not present

## 2017-07-10 DIAGNOSIS — M9903 Segmental and somatic dysfunction of lumbar region: Secondary | ICD-10-CM | POA: Diagnosis not present

## 2017-07-10 DIAGNOSIS — M9902 Segmental and somatic dysfunction of thoracic region: Secondary | ICD-10-CM | POA: Diagnosis not present

## 2017-07-10 DIAGNOSIS — M530 Cervicocranial syndrome: Secondary | ICD-10-CM | POA: Diagnosis not present

## 2017-07-17 DIAGNOSIS — M9901 Segmental and somatic dysfunction of cervical region: Secondary | ICD-10-CM | POA: Diagnosis not present

## 2017-07-17 DIAGNOSIS — M530 Cervicocranial syndrome: Secondary | ICD-10-CM | POA: Diagnosis not present

## 2017-07-17 DIAGNOSIS — M9903 Segmental and somatic dysfunction of lumbar region: Secondary | ICD-10-CM | POA: Diagnosis not present

## 2017-07-17 DIAGNOSIS — M9902 Segmental and somatic dysfunction of thoracic region: Secondary | ICD-10-CM | POA: Diagnosis not present

## 2017-07-24 DIAGNOSIS — M9903 Segmental and somatic dysfunction of lumbar region: Secondary | ICD-10-CM | POA: Diagnosis not present

## 2017-07-24 DIAGNOSIS — M9902 Segmental and somatic dysfunction of thoracic region: Secondary | ICD-10-CM | POA: Diagnosis not present

## 2017-07-24 DIAGNOSIS — M9901 Segmental and somatic dysfunction of cervical region: Secondary | ICD-10-CM | POA: Diagnosis not present

## 2017-07-24 DIAGNOSIS — M530 Cervicocranial syndrome: Secondary | ICD-10-CM | POA: Diagnosis not present

## 2017-07-31 DIAGNOSIS — M9902 Segmental and somatic dysfunction of thoracic region: Secondary | ICD-10-CM | POA: Diagnosis not present

## 2017-07-31 DIAGNOSIS — M9901 Segmental and somatic dysfunction of cervical region: Secondary | ICD-10-CM | POA: Diagnosis not present

## 2017-07-31 DIAGNOSIS — M530 Cervicocranial syndrome: Secondary | ICD-10-CM | POA: Diagnosis not present

## 2017-07-31 DIAGNOSIS — M9903 Segmental and somatic dysfunction of lumbar region: Secondary | ICD-10-CM | POA: Diagnosis not present

## 2017-08-07 DIAGNOSIS — M9902 Segmental and somatic dysfunction of thoracic region: Secondary | ICD-10-CM | POA: Diagnosis not present

## 2017-08-07 DIAGNOSIS — M9901 Segmental and somatic dysfunction of cervical region: Secondary | ICD-10-CM | POA: Diagnosis not present

## 2017-08-07 DIAGNOSIS — M530 Cervicocranial syndrome: Secondary | ICD-10-CM | POA: Diagnosis not present

## 2017-08-07 DIAGNOSIS — M9903 Segmental and somatic dysfunction of lumbar region: Secondary | ICD-10-CM | POA: Diagnosis not present

## 2017-08-16 DIAGNOSIS — M9903 Segmental and somatic dysfunction of lumbar region: Secondary | ICD-10-CM | POA: Diagnosis not present

## 2017-08-16 DIAGNOSIS — M9902 Segmental and somatic dysfunction of thoracic region: Secondary | ICD-10-CM | POA: Diagnosis not present

## 2017-08-16 DIAGNOSIS — M530 Cervicocranial syndrome: Secondary | ICD-10-CM | POA: Diagnosis not present

## 2017-08-16 DIAGNOSIS — M9901 Segmental and somatic dysfunction of cervical region: Secondary | ICD-10-CM | POA: Diagnosis not present

## 2017-08-19 ENCOUNTER — Other Ambulatory Visit: Payer: Self-pay | Admitting: Physician Assistant

## 2017-08-22 DIAGNOSIS — M9902 Segmental and somatic dysfunction of thoracic region: Secondary | ICD-10-CM | POA: Diagnosis not present

## 2017-08-22 DIAGNOSIS — M530 Cervicocranial syndrome: Secondary | ICD-10-CM | POA: Diagnosis not present

## 2017-08-22 DIAGNOSIS — M9901 Segmental and somatic dysfunction of cervical region: Secondary | ICD-10-CM | POA: Diagnosis not present

## 2017-08-22 DIAGNOSIS — M9903 Segmental and somatic dysfunction of lumbar region: Secondary | ICD-10-CM | POA: Diagnosis not present

## 2017-09-05 DIAGNOSIS — M530 Cervicocranial syndrome: Secondary | ICD-10-CM | POA: Diagnosis not present

## 2017-09-05 DIAGNOSIS — M9903 Segmental and somatic dysfunction of lumbar region: Secondary | ICD-10-CM | POA: Diagnosis not present

## 2017-09-05 DIAGNOSIS — M9902 Segmental and somatic dysfunction of thoracic region: Secondary | ICD-10-CM | POA: Diagnosis not present

## 2017-09-05 DIAGNOSIS — M9901 Segmental and somatic dysfunction of cervical region: Secondary | ICD-10-CM | POA: Diagnosis not present

## 2017-09-20 ENCOUNTER — Other Ambulatory Visit: Payer: Self-pay | Admitting: Physician Assistant

## 2017-09-27 DIAGNOSIS — M530 Cervicocranial syndrome: Secondary | ICD-10-CM | POA: Diagnosis not present

## 2017-09-27 DIAGNOSIS — M9903 Segmental and somatic dysfunction of lumbar region: Secondary | ICD-10-CM | POA: Diagnosis not present

## 2017-09-27 DIAGNOSIS — M9902 Segmental and somatic dysfunction of thoracic region: Secondary | ICD-10-CM | POA: Diagnosis not present

## 2017-09-27 DIAGNOSIS — M9901 Segmental and somatic dysfunction of cervical region: Secondary | ICD-10-CM | POA: Diagnosis not present

## 2017-10-03 DIAGNOSIS — M530 Cervicocranial syndrome: Secondary | ICD-10-CM | POA: Diagnosis not present

## 2017-10-03 DIAGNOSIS — M9902 Segmental and somatic dysfunction of thoracic region: Secondary | ICD-10-CM | POA: Diagnosis not present

## 2017-10-03 DIAGNOSIS — M9901 Segmental and somatic dysfunction of cervical region: Secondary | ICD-10-CM | POA: Diagnosis not present

## 2017-10-03 DIAGNOSIS — M9903 Segmental and somatic dysfunction of lumbar region: Secondary | ICD-10-CM | POA: Diagnosis not present

## 2017-10-17 DIAGNOSIS — M9903 Segmental and somatic dysfunction of lumbar region: Secondary | ICD-10-CM | POA: Diagnosis not present

## 2017-10-17 DIAGNOSIS — M9902 Segmental and somatic dysfunction of thoracic region: Secondary | ICD-10-CM | POA: Diagnosis not present

## 2017-10-17 DIAGNOSIS — M9901 Segmental and somatic dysfunction of cervical region: Secondary | ICD-10-CM | POA: Diagnosis not present

## 2017-10-17 DIAGNOSIS — M530 Cervicocranial syndrome: Secondary | ICD-10-CM | POA: Diagnosis not present

## 2017-11-02 DIAGNOSIS — M9902 Segmental and somatic dysfunction of thoracic region: Secondary | ICD-10-CM | POA: Diagnosis not present

## 2017-11-02 DIAGNOSIS — M9901 Segmental and somatic dysfunction of cervical region: Secondary | ICD-10-CM | POA: Diagnosis not present

## 2017-11-02 DIAGNOSIS — M9903 Segmental and somatic dysfunction of lumbar region: Secondary | ICD-10-CM | POA: Diagnosis not present

## 2017-11-02 DIAGNOSIS — M530 Cervicocranial syndrome: Secondary | ICD-10-CM | POA: Diagnosis not present

## 2017-11-08 DIAGNOSIS — M9901 Segmental and somatic dysfunction of cervical region: Secondary | ICD-10-CM | POA: Diagnosis not present

## 2017-11-08 DIAGNOSIS — M9903 Segmental and somatic dysfunction of lumbar region: Secondary | ICD-10-CM | POA: Diagnosis not present

## 2017-11-08 DIAGNOSIS — M530 Cervicocranial syndrome: Secondary | ICD-10-CM | POA: Diagnosis not present

## 2017-11-08 DIAGNOSIS — M9902 Segmental and somatic dysfunction of thoracic region: Secondary | ICD-10-CM | POA: Diagnosis not present

## 2017-11-11 ENCOUNTER — Other Ambulatory Visit: Payer: Self-pay | Admitting: Physician Assistant

## 2017-11-22 ENCOUNTER — Encounter: Payer: Self-pay | Admitting: Physician Assistant

## 2017-11-22 ENCOUNTER — Encounter: Payer: BLUE CROSS/BLUE SHIELD | Admitting: Physician Assistant

## 2017-11-22 ENCOUNTER — Ambulatory Visit (INDEPENDENT_AMBULATORY_CARE_PROVIDER_SITE_OTHER): Payer: BLUE CROSS/BLUE SHIELD | Admitting: Physician Assistant

## 2017-11-22 VITALS — BP 139/78 | HR 97 | Ht 68.0 in | Wt 227.0 lb

## 2017-11-22 DIAGNOSIS — Z13220 Encounter for screening for lipoid disorders: Secondary | ICD-10-CM | POA: Diagnosis not present

## 2017-11-22 DIAGNOSIS — Z0000 Encounter for general adult medical examination without abnormal findings: Secondary | ICD-10-CM

## 2017-11-22 DIAGNOSIS — R61 Generalized hyperhidrosis: Secondary | ICD-10-CM

## 2017-11-22 DIAGNOSIS — Z131 Encounter for screening for diabetes mellitus: Secondary | ICD-10-CM | POA: Diagnosis not present

## 2017-11-22 DIAGNOSIS — Z113 Encounter for screening for infections with a predominantly sexual mode of transmission: Secondary | ICD-10-CM | POA: Diagnosis not present

## 2017-11-22 NOTE — Progress Notes (Signed)
Subjective:    Patient ID: Devin Kim, male    DOB: 1996-02-24, 22 y.o.   MRN: [id number]  HPI Pt is a 22 yo male who presents to the clinic for CPE.   He has had some recent intense night sweats. He wakes up and has to change his clothes. No new medications. No fever, chills, body aches. No weight loss. No cough.   .. Active Ambulatory Problems    Diagnosis Date Noted  . Hypothyroidism 08/09/2013  . Right upper quadrant pain 08/12/2013  . Nausea with vomiting 08/12/2013  . Seizures ([facility]) 08/30/2013  . Anal fissure 10/21/2013  . Syphilis 11/25/2013  . Essential hypertension, benign 08/27/2014  . GAD (generalized anxiety disorder) 08/27/2014  . High risk sexual behavior 08/27/2014  . Tobacco dependence in remission 08/10/2015  . High risk bisexual behavior 08/26/2016  . Erectile dysfunction 08/26/2016  . Frequent headaches 08/26/2016  . Thrombosed hemorrhoids 03/24/2017  . Tachycardia determined by examination of pulse 06/01/2017   Resolved Ambulatory Problems    Diagnosis Date Noted  . Tobacco abuse 08/09/2013  . Tobacco abuse 11/22/2013   Past Medical History:  Diagnosis Date  . Hypertension   . Thyroid disease    .Marland Kitchen Family History  Problem Relation Age of Onset  . Alcohol abuse Father   . Hypertension Father   . Diabetes Paternal Uncle   . Cancer Paternal Grandmother   . Hypertension Paternal Grandfather    .Marland Kitchen Social History   Socioeconomic History  . Marital status: Single    Spouse name: Not on file  . Number of children: Not on file  . Years of education: Not on file  . Highest education level: Not on file  Occupational History  . Not on file  Social Needs  . Financial resource strain: Not on file  . Food insecurity:    Worry: Not on file    Inability: Not on file  . Transportation needs:    Medical: Not on file    Non-medical: Not on file  Tobacco Use  . Smoking status: Former Smoker    Start date: 08/26/2013  . Smokeless tobacco: Never Used   Substance and Sexual Activity  . Alcohol use: No    Alcohol/week: 0.0 oz  . Drug use: No  . Sexual activity: Not Currently  Lifestyle  . Physical activity:    Days per week: Not on file    Minutes per session: Not on file  . Stress: Not on file  Relationships  . Social connections:    Talks on phone: Not on file    Gets together: Not on file    Attends religious service: Not on file    Active member of club or organization: Not on file    Attends meetings of clubs or organizations: Not on file    Relationship status: Not on file  . Intimate partner violence:    Fear of current or ex partner: Not on file    Emotionally abused: Not on file    Physically abused: Not on file    Forced sexual activity: Not on file  Other Topics Concern  . Not on file  Social History Narrative  . Not on file     Review of Systems  All other systems reviewed and are negative.      Objective:   Physical Exam  BP 139/78   Pulse 97   Ht  (1.727 m)   Wt 227 lb (103 kg)   BMI  34.52 kg/m   General Appearance:    Alert, cooperative, no distress, appears stated age  Head:    Normocephalic, without obvious abnormality, atraumatic  Eyes:    PERRL, conjunctiva/corneas clear, EOM's intact, fundi    benign, both eyes       Ears:    Normal TM's and external ear canals, both ears  Nose:   Nares normal, septum midline, mucosa normal, no drainage    or sinus tenderness  Throat:   Lips, mucosa, and tongue normal; teeth and gums normal  Neck:   Supple, symmetrical, trachea midline, no adenopathy;       thyroid:  No enlargement/tenderness/nodules; no carotid   bruit or JVD  Back:     Symmetric, no curvature, ROM normal, no CVA tenderness  Lungs:     Clear to auscultation bilaterally, respirations unlabored  Chest wall:    No tenderness or deformity  Heart:    Regular rate and rhythm, S1 and S2 normal, no murmur, rub   or gallop  Abdomen:     Soft, non-tender, bowel sounds active all four  quadrants,    no masses, no organomegaly        Extremities:   Extremities normal, atraumatic, no cyanosis or edema  Pulses:   2+ and symmetric all extremities  Skin:   Skin color, texture, turgor normal, no rashes or lesions  Lymph nodes:   Cervical, supraclavicular, and axillary nodes normal  Neurologic:   CNII-XII intact. Normal strength, sensation and reflexes      throughout        Assessment & Plan:  Marland KitchenMarland KitchenLaray was seen today for annual exam.  Diagnoses and all orders for this visit:  Routine physical examination -     Lipid Panel w/reflex Direct LDL -     COMPLETE METABOLIC PANEL WITH GFR -     TSH -     CBC with Differential/Platelet -     Testosterone  Night sweats -     TSH -     CBC with Differential/Platelet -     Testosterone  Screening for diabetes mellitus -     COMPLETE METABOLIC PANEL WITH GFR  Screening for lipid disorders -     Lipid Panel w/reflex Direct LDL  Screening examination for STD (sexually transmitted disease) -     HIV antibody (with reflex) -     RPR -     C. trachomatis/N. gonorrhoeae RNA  .Marland Kitchen Depression screen [facility] 2/9 11/22/2017 10/05/2016  Decreased Interest 0 0  Down, Depressed, Hopeless 0 0  PHQ - 2 Score 0 0   .Marland KitchenStart a regular exercise program and make sure you are eating a healthy diet Try to eat 4 servings of dairy a day or take a calcium supplement (  twice a day). Your vaccines are up to date.  STD screenings done today.  Fasting labs ordered. Labs to evaluate night sweats.  Discussed weight gain over past year. Pt agrees to start watching calories and to stay active.

## 2017-11-22 NOTE — Patient Instructions (Signed)

## 2017-11-23 DIAGNOSIS — M9902 Segmental and somatic dysfunction of thoracic region: Secondary | ICD-10-CM | POA: Diagnosis not present

## 2017-11-23 DIAGNOSIS — M9903 Segmental and somatic dysfunction of lumbar region: Secondary | ICD-10-CM | POA: Diagnosis not present

## 2017-11-23 DIAGNOSIS — M530 Cervicocranial syndrome: Secondary | ICD-10-CM | POA: Diagnosis not present

## 2017-11-23 DIAGNOSIS — M9901 Segmental and somatic dysfunction of cervical region: Secondary | ICD-10-CM | POA: Diagnosis not present

## 2017-11-23 DIAGNOSIS — R61 Generalized hyperhidrosis: Secondary | ICD-10-CM | POA: Insufficient documentation

## 2017-11-23 LAB — TSH: TSH: 2.29 mIU/L (ref 0.40–4.50)

## 2017-11-23 LAB — CBC WITH DIFFERENTIAL/PLATELET
BASOS PCT: 0.8 %
Basophils Absolute: 79 cells/uL (ref 0–200)
Eosinophils Absolute: 178 cells/uL (ref 15–500)
Eosinophils Relative: 1.8 %
HCT: 44.1 % (ref 38.5–50.0)
Hemoglobin: 15 g/dL (ref 13.2–17.1)
Lymphs Abs: 2000 cells/uL (ref 850–3900)
MCH: 29.5 pg (ref 27.0–33.0)
MCHC: 34 g/dL (ref 32.0–36.0)
MCV: 86.6 fL (ref 80.0–100.0)
MONOS PCT: 9.6 %
MPV: 11.1 fL (ref 7.5–12.5)
Neutro Abs: 6692 cells/uL (ref 1500–7800)
Neutrophils Relative %: 67.6 %
PLATELETS: 199 10*3/uL (ref 140–400)
RBC: 5.09 10*6/uL (ref 4.20–5.80)
RDW: 12.7 % (ref 11.0–15.0)
TOTAL LYMPHOCYTE: 20.2 %
WBC mixed population: 950 cells/uL (ref 200–950)
WBC: 9.9 10*3/uL (ref 3.8–10.8)

## 2017-11-23 LAB — COMPLETE METABOLIC PANEL WITH GFR
AG RATIO: 2 (calc) (ref 1.0–2.5)
ALKALINE PHOSPHATASE (APISO): 53 U/L (ref 40–115)
ALT: 65 U/L — AB (ref 9–46)
AST: 37 U/L (ref 10–40)
Albumin: 4.5 g/dL (ref 3.6–5.1)
BILIRUBIN TOTAL: 0.4 mg/dL (ref 0.2–1.2)
BUN: 12 mg/dL (ref 7–25)
CALCIUM: 9.6 mg/dL (ref 8.6–10.3)
CHLORIDE: 105 mmol/L (ref 98–110)
CO2: 24 mmol/L (ref 20–32)
Creat: 0.86 mg/dL (ref 0.60–1.35)
GFR, Est African American: 141 mL/min/{1.73_m2} (ref >=60)
GFR, Est Non African American: 121 mL/min/{1.73_m2} (ref >=60)
Globulin: 2.3 g/dL (calc) (ref 1.9–3.7)
Glucose, Bld: 81 mg/dL (ref 65–99)
POTASSIUM: 4.4 mmol/L (ref 3.5–5.3)
Sodium: 139 mmol/L (ref 135–146)
Total Protein: 6.8 g/dL (ref 6.1–8.1)

## 2017-11-23 LAB — C. TRACHOMATIS/N. GONORRHOEAE RNA
C. trachomatis RNA, TMA: NOT DETECTED
N. GONORRHOEAE RNA, TMA: NOT DETECTED

## 2017-11-23 LAB — RPR: RPR Ser Ql: NONREACTIVE

## 2017-11-23 LAB — LIPID PANEL W/REFLEX DIRECT LDL
CHOL/HDL RATIO: 5.1 (calc) — AB (ref <5.0)
Cholesterol: 216 mg/dL — ABNORMAL HIGH (ref <200)
HDL: 42 mg/dL (ref >40)
LDL Cholesterol (Calc): 137 mg/dL (calc) — ABNORMAL HIGH
NON-HDL CHOLESTEROL (CALC): 174 mg/dL — AB (ref <130)
TRIGLYCERIDES: 228 mg/dL — AB (ref <150)

## 2017-11-23 LAB — HIV ANTIBODY (ROUTINE TESTING W REFLEX): HIV: NONREACTIVE

## 2017-11-23 LAB — TESTOSTERONE: Testosterone: 306 ng/dL (ref 250–827)

## 2017-11-24 ENCOUNTER — Encounter: Payer: Self-pay | Admitting: Physician Assistant

## 2017-11-24 DIAGNOSIS — E781 Pure hyperglyceridemia: Secondary | ICD-10-CM | POA: Insufficient documentation

## 2017-11-24 DIAGNOSIS — E7800 Pure hypercholesterolemia, unspecified: Secondary | ICD-10-CM | POA: Insufficient documentation

## 2017-11-24 DIAGNOSIS — R748 Abnormal levels of other serum enzymes: Secondary | ICD-10-CM | POA: Insufficient documentation

## 2017-11-24 NOTE — Progress Notes (Signed)
Call pt:  STD screenings negative.  Kidney and sugar looks great.  One liver enzyme elevated.have you been taking a lot of tylenol or drinking alcohol regularly. Stop both and recheck in 2 weeks.  Cholesterol is up but ok for age and risk factors. Continue to limit high fatty foods/fried foods. Exercise regularly. HDL went up some which is good news.

## 2017-12-11 DIAGNOSIS — M530 Cervicocranial syndrome: Secondary | ICD-10-CM | POA: Diagnosis not present

## 2017-12-11 DIAGNOSIS — M9902 Segmental and somatic dysfunction of thoracic region: Secondary | ICD-10-CM | POA: Diagnosis not present

## 2017-12-11 DIAGNOSIS — M9901 Segmental and somatic dysfunction of cervical region: Secondary | ICD-10-CM | POA: Diagnosis not present

## 2017-12-11 DIAGNOSIS — M9903 Segmental and somatic dysfunction of lumbar region: Secondary | ICD-10-CM | POA: Diagnosis not present

## 2017-12-18 ENCOUNTER — Other Ambulatory Visit: Payer: Self-pay

## 2017-12-18 MED ORDER — LEVOTHYROXINE SODIUM 75 MCG PO TABS: ORAL_TABLET | ORAL | 1 refills | Status: DC

## 2018-01-01 DIAGNOSIS — M9903 Segmental and somatic dysfunction of lumbar region: Secondary | ICD-10-CM | POA: Diagnosis not present

## 2018-01-01 DIAGNOSIS — M9902 Segmental and somatic dysfunction of thoracic region: Secondary | ICD-10-CM | POA: Diagnosis not present

## 2018-01-01 DIAGNOSIS — M9901 Segmental and somatic dysfunction of cervical region: Secondary | ICD-10-CM | POA: Diagnosis not present

## 2018-01-01 DIAGNOSIS — M530 Cervicocranial syndrome: Secondary | ICD-10-CM | POA: Diagnosis not present

## 2018-01-15 DIAGNOSIS — M9903 Segmental and somatic dysfunction of lumbar region: Secondary | ICD-10-CM | POA: Diagnosis not present

## 2018-01-15 DIAGNOSIS — M530 Cervicocranial syndrome: Secondary | ICD-10-CM | POA: Diagnosis not present

## 2018-01-15 DIAGNOSIS — M9902 Segmental and somatic dysfunction of thoracic region: Secondary | ICD-10-CM | POA: Diagnosis not present

## 2018-01-15 DIAGNOSIS — M9901 Segmental and somatic dysfunction of cervical region: Secondary | ICD-10-CM | POA: Diagnosis not present

## 2018-01-29 DIAGNOSIS — M9902 Segmental and somatic dysfunction of thoracic region: Secondary | ICD-10-CM | POA: Diagnosis not present

## 2018-01-29 DIAGNOSIS — M9901 Segmental and somatic dysfunction of cervical region: Secondary | ICD-10-CM | POA: Diagnosis not present

## 2018-01-29 DIAGNOSIS — M9903 Segmental and somatic dysfunction of lumbar region: Secondary | ICD-10-CM | POA: Diagnosis not present

## 2018-01-29 DIAGNOSIS — M530 Cervicocranial syndrome: Secondary | ICD-10-CM | POA: Diagnosis not present

## 2018-02-19 DIAGNOSIS — M9901 Segmental and somatic dysfunction of cervical region: Secondary | ICD-10-CM | POA: Diagnosis not present

## 2018-02-19 DIAGNOSIS — M9902 Segmental and somatic dysfunction of thoracic region: Secondary | ICD-10-CM | POA: Diagnosis not present

## 2018-02-19 DIAGNOSIS — M530 Cervicocranial syndrome: Secondary | ICD-10-CM | POA: Diagnosis not present

## 2018-02-19 DIAGNOSIS — M9903 Segmental and somatic dysfunction of lumbar region: Secondary | ICD-10-CM | POA: Diagnosis not present

## 2018-02-27 ENCOUNTER — Ambulatory Visit (INDEPENDENT_AMBULATORY_CARE_PROVIDER_SITE_OTHER): Payer: BLUE CROSS/BLUE SHIELD | Admitting: Physician Assistant

## 2018-02-27 ENCOUNTER — Encounter: Payer: Self-pay | Admitting: Physician Assistant

## 2018-02-27 VITALS — BP 133/54 | HR 79 | Ht 68.0 in | Wt 233.0 lb

## 2018-02-27 DIAGNOSIS — G43009 Migraine without aura, not intractable, without status migrainosus: Secondary | ICD-10-CM | POA: Insufficient documentation

## 2018-02-27 DIAGNOSIS — K30 Functional dyspepsia: Secondary | ICD-10-CM | POA: Diagnosis not present

## 2018-02-27 MED ORDER — TOPIRAMATE ER 50 MG PO CAP24: ORAL_CAPSULE | ORAL | 0 refills | Status: DC

## 2018-02-27 MED ORDER — OMEPRAZOLE 40 MG PO CPDR: 40.0000 mg | DELAYED_RELEASE_CAPSULE | Freq: Every day | ORAL | 3 refills | Status: DC

## 2018-02-27 MED ORDER — RIZATRIPTAN BENZOATE 10 MG PO TABS: 10.0000 mg | ORAL_TABLET | ORAL | 0 refills | Status: DC | PRN

## 2018-02-27 NOTE — Progress Notes (Signed)
Subjective:    Devin Kim ID: Devin Kim, male    DOB: [birth year], 22 y.o.   MRN: [id number]  HPI  Devin Kim is a 22 year old male who presents to the clinic to discuss episode of extreme headache and shakiness that happened 2 weeks ago on Wednesday.  Devin Kim does have a history of seizures in his teenage years.  He was on antiseizure medication until his early 60s.  He took himself off this medication.  He has not seen a neurologist since his teenage years.  He has been having increasing frequency of headaches in intensity.  He would report almost every other day having a migraine or some type of headache.  He is very sound and light sensitive.  He denies any auras.  2 weeks ago he had a severe episode of a headache that continued to worsen.  It was so bad he declined under his desk and coworker said he was shaking.  He really does not remember much of this.  Coworkers report this went on for about 30 minutes.  EMS was called.  His vitals were taken and he declined going to the hospital.  He went home and went to sleep after taking ibuprofen.  When he woke up the headache was better.  Most of his headaches do respond to rest and ibuprofen.  He does deny any double vision, speech changes or dizziness.  He feels like his headaches have been worsening over the last 6 months.  Devin Kim denies any loss of bowel or bladder function.  Devin Kim does admit to using marijuana daily to control his anxiety and stress.  He has done this for years.  Devin Kim also complains of increasing indigestion feelings.  Tums does help with symptoms.  Most of his indigestion occurs after heavy meals.  .. Active Ambulatory Problems    Diagnosis Date Noted  . Hypothyroidism 08/09/2013  . Right upper quadrant pain 08/12/2013  . Nausea with vomiting 08/12/2013  . Seizures ([facility]) 08/30/2013  . Anal fissure 10/21/2013  . Essential hypertension, benign 08/27/2014  . GAD (generalized anxiety disorder) 08/27/2014  . High risk sexual  behavior 08/27/2014  . Tobacco dependence in remission 08/10/2015  . High risk bisexual behavior 08/26/2016  . Erectile dysfunction 08/26/2016  . Frequent headaches 08/26/2016  . Thrombosed hemorrhoids 03/24/2017  . Tachycardia determined by examination of pulse 06/01/2017  . Night sweats 11/23/2017  . Elevated liver enzymes 11/24/2017  . Hypertriglyceridemia 11/24/2017  . Elevated LDL cholesterol level 11/24/2017  . Indigestion 02/27/2018  . Migraine without aura and without status migrainosus, not intractable 02/27/2018   Resolved Ambulatory Problems    Diagnosis Date Noted  . Tobacco abuse 08/09/2013  . Tobacco abuse 11/22/2013  . Syphilis 11/25/2013   Past Medical History:  Diagnosis Date  . Hypertension   . Thyroid disease        Review of Systems See HPI.     Objective:   Physical Exam  Constitutional: He is oriented to person, place, and time. He appears well-developed and well-nourished.  HENT:  Head: Normocephalic and atraumatic.  Cardiovascular: Normal rate and regular rhythm.  Pulmonary/Chest: Effort normal and breath sounds normal.  Neurological: He is alert and oriented to person, place, and time. He displays normal reflexes. No cranial nerve deficit. Coordination normal.  Psychiatric: He has a normal mood and affect. His behavior is normal.          Assessment & Plan:  Marland KitchenMarland KitchenDiagnoses and all orders for this visit:  Migraine without aura and  without status migrainosus, not intractable -     Topiramate ER (TROKENDI XR) 50 MG CP24; Take one tablet daily. -     rizatriptan (MAXALT) 10 MG tablet; Take 1 tablet (10 mg total) by mouth as needed for migraine. May repeat in 2 hours if needed  Indigestion -     omeprazole (PRILOSEC) 40 MG capsule; Take 1 capsule (40 mg total) by mouth daily.   Reassured Devin Kim I do not feel like symptoms are consistent with seizure activity.  I offered a neurology referral.  Devin Kim did decline.  I do think the symptoms are  consistent with migraine.  Started preventative with Trokendi.  Discussed side effects.  I did go ahead and give Devin Kim a rescue migraine medication to use as needed at onset of headache.  Her goal is to decrease frequency of migraines.  At this point he is having greater than 15 headache days a month.  Certainly we can titrate up on Trokendi to maximum dose if there is some improvement.  Discussed if he has any other episodes of a headache that are as severe as the last to please contact our office.  We will try omeprazole to help with indigestion.  Discussed other causes of indigestion.  Discussed changing up diet to be less acidic and considering weight loss.  Follow-up in 1 month.

## 2018-02-27 NOTE — Patient Instructions (Addendum)
Start topamax.  maxalt at onset of headache.    Migraine Headache A migraine headache is a very strong throbbing pain on one side or both sides of your head. Migraines can also cause other symptoms. Talk with your doctor about what things may bring on (trigger) your migraine headaches. Follow these instructions at home: Medicines  Take over-the-counter and prescription medicines only as told by your doctor.  Do not drive or use heavy machinery while taking prescription pain medicine.  To prevent or treat constipation while you are taking prescription pain medicine, your doctor may recommend that you: ? Drink enough fluid to keep your pee (urine) clear or pale yellow. ? Take over-the-counter or prescription medicines. ? Eat foods that are high in fiber. These include fresh fruits and vegetables, whole grains, and beans. ? Limit foods that are high in fat and processed sugars. These include fried and sweet foods. Lifestyle  Avoid alcohol.  Do not use any products that contain nicotine or tobacco, such as cigarettes and e-cigarettes. If you need help quitting, ask your doctor.  Get at least 8 hours of sleep every night.  Limit your stress. General instructions   Keep a journal to find out what may bring on your migraines. For example, write down: ? What you eat and drink. ? How much sleep you get. ? Any change in what you eat or drink. ? Any change in your medicines.  If you have a migraine: ? Avoid things that make your symptoms worse, such as bright lights. ? It may help to lie down in a dark, quiet room. ? Do not drive or use heavy machinery. ? Ask your doctor what activities are safe for you.  Keep all follow-up visits as told by your doctor. This is important. Contact a doctor if:  You get a migraine that is different or worse than your usual migraines. Get help right away if:  Your migraine gets very bad.  You have a fever.  You have a stiff neck.  You have  trouble seeing.  Your muscles feel weak or like you cannot control them.  You start to lose your balance a lot.  You start to have trouble walking.  You pass out (faint). This information is not intended to replace advice given to you by your health care provider. Make sure you discuss any questions you have with your health care provider. Document Released: 03/22/2008 Document Revised: 01/01/2016 Document Reviewed: 11/30/2015 Elsevier Interactive Patient Education  2018 [employer].

## 2018-03-05 DIAGNOSIS — M530 Cervicocranial syndrome: Secondary | ICD-10-CM | POA: Diagnosis not present

## 2018-03-05 DIAGNOSIS — M9902 Segmental and somatic dysfunction of thoracic region: Secondary | ICD-10-CM | POA: Diagnosis not present

## 2018-03-05 DIAGNOSIS — M9903 Segmental and somatic dysfunction of lumbar region: Secondary | ICD-10-CM | POA: Diagnosis not present

## 2018-03-05 DIAGNOSIS — M9901 Segmental and somatic dysfunction of cervical region: Secondary | ICD-10-CM | POA: Diagnosis not present

## 2018-03-19 DIAGNOSIS — M9902 Segmental and somatic dysfunction of thoracic region: Secondary | ICD-10-CM | POA: Diagnosis not present

## 2018-03-19 DIAGNOSIS — M9901 Segmental and somatic dysfunction of cervical region: Secondary | ICD-10-CM | POA: Diagnosis not present

## 2018-03-19 DIAGNOSIS — M9903 Segmental and somatic dysfunction of lumbar region: Secondary | ICD-10-CM | POA: Diagnosis not present

## 2018-03-19 DIAGNOSIS — M530 Cervicocranial syndrome: Secondary | ICD-10-CM | POA: Diagnosis not present

## 2018-03-27 ENCOUNTER — Encounter: Payer: Self-pay | Admitting: Physician Assistant

## 2018-03-27 ENCOUNTER — Ambulatory Visit (INDEPENDENT_AMBULATORY_CARE_PROVIDER_SITE_OTHER): Payer: BLUE CROSS/BLUE SHIELD | Admitting: Physician Assistant

## 2018-03-27 VITALS — BP 124/76 | HR 84 | Ht 68.0 in | Wt 227.0 lb

## 2018-03-27 DIAGNOSIS — M542 Cervicalgia: Secondary | ICD-10-CM | POA: Diagnosis not present

## 2018-03-27 DIAGNOSIS — I10 Essential (primary) hypertension: Secondary | ICD-10-CM | POA: Diagnosis not present

## 2018-03-27 DIAGNOSIS — G43009 Migraine without aura, not intractable, without status migrainosus: Secondary | ICD-10-CM

## 2018-03-27 DIAGNOSIS — G8929 Other chronic pain: Secondary | ICD-10-CM | POA: Diagnosis not present

## 2018-03-27 MED ORDER — TOPIRAMATE ER 100 MG PO CAP24: 1.0000 | ORAL_CAPSULE | Freq: Every day | ORAL | 2 refills | Status: DC

## 2018-03-27 NOTE — Progress Notes (Signed)
   Subjective:    Patient ID: Devin Kim, male    DOB: Dec 05, 1995, 22 y.o.   MRN: [id number]  HPI Pt is a 22 yo male with pmhx of HTN, migraines, hypothyroidism, GAD who presents to the clinic to follow up on migraines.   Pt migraines have decreased a lot as well as decreased in severity. He is still having days of dull tension like headaches and continues to have chronic neck pain. He goes to chiropractor at least once a month as well as massage therapy. He denies any CP, palpitations, vision changes. Unilateral headaches have decreased to about 3 a month.   .. Active Ambulatory Problems    Diagnosis Date Noted  . Hypothyroidism 08/09/2013  . Right upper quadrant pain 08/12/2013  . Nausea with vomiting 08/12/2013  . Seizures (HCC) 08/30/2013  . Anal fissure 10/21/2013  . Essential hypertension, benign 08/27/2014  . GAD (generalized anxiety disorder) 08/27/2014  . High risk sexual behavior 08/27/2014  . Tobacco dependence in remission 08/10/2015  . High risk bisexual behavior 08/26/2016  . Erectile dysfunction 08/26/2016  . Frequent headaches 08/26/2016  . Thrombosed hemorrhoids 03/24/2017  . Tachycardia determined by examination of pulse 06/01/2017  . Night sweats 11/23/2017  . Elevated liver enzymes 11/24/2017  . Hypertriglyceridemia 11/24/2017  . Elevated LDL cholesterol level 11/24/2017  . Indigestion 02/27/2018  . Migraine without aura and without status migrainosus, not intractable 02/27/2018  . Chronic neck pain 03/27/2018   Resolved Ambulatory Problems    Diagnosis Date Noted  . Tobacco abuse 08/09/2013  . Tobacco abuse 11/22/2013  . Syphilis 11/25/2013   Past Medical History:  Diagnosis Date  . Hypertension   . Thyroid disease       Review of Systems  All other systems reviewed and are negative.      Objective:   Physical Exam  Constitutional: He is oriented to person, place, and time. He appears well-developed and well-nourished.  HENT:  Head:  Normocephalic and atraumatic.  Right Ear: External ear normal.  Left Ear: External ear normal.  Neck: Normal range of motion. Neck supple.  Cardiovascular: Normal rate and regular rhythm.  Pulmonary/Chest: Effort normal and breath sounds normal.  Musculoskeletal:  NROM of neck.  No tenderness over spine.  Very tight upper back muscle(trapezius)   Neurological: He is alert and oriented to person, place, and time.  Psychiatric: He has a normal mood and affect. His behavior is normal.          Assessment & Plan:  Marland KitchenMarland KitchenDiagnoses and all orders for this visit:  Migraine without aura and without status migrainosus, not intractable -     Topiramate ER (TROKENDI XR) 100 MG CP24; Take 1 tablet by mouth daily.  Essential hypertension, benign  Chronic neck pain   Increased trokendi. Follow up in 6 months. I feel like headaches are a mix between migraines and tension headaches. Continue with chiropractor and massage therapy. maxalt for rescue. Use NSAIDs as needed for neck pain.   2nd BP recheck much better. Stay on same dose of lisinopril.   Follow up in 6 months.

## 2018-04-05 DIAGNOSIS — M9901 Segmental and somatic dysfunction of cervical region: Secondary | ICD-10-CM | POA: Diagnosis not present

## 2018-04-05 DIAGNOSIS — M530 Cervicocranial syndrome: Secondary | ICD-10-CM | POA: Diagnosis not present

## 2018-04-05 DIAGNOSIS — M9902 Segmental and somatic dysfunction of thoracic region: Secondary | ICD-10-CM | POA: Diagnosis not present

## 2018-04-05 DIAGNOSIS — M9903 Segmental and somatic dysfunction of lumbar region: Secondary | ICD-10-CM | POA: Diagnosis not present

## 2018-04-24 DIAGNOSIS — M530 Cervicocranial syndrome: Secondary | ICD-10-CM | POA: Diagnosis not present

## 2018-04-24 DIAGNOSIS — M9902 Segmental and somatic dysfunction of thoracic region: Secondary | ICD-10-CM | POA: Diagnosis not present

## 2018-04-24 DIAGNOSIS — M9901 Segmental and somatic dysfunction of cervical region: Secondary | ICD-10-CM | POA: Diagnosis not present

## 2018-04-24 DIAGNOSIS — M9903 Segmental and somatic dysfunction of lumbar region: Secondary | ICD-10-CM | POA: Diagnosis not present

## 2018-05-02 ENCOUNTER — Encounter: Payer: Self-pay | Admitting: Physician Assistant

## 2018-05-02 ENCOUNTER — Ambulatory Visit (INDEPENDENT_AMBULATORY_CARE_PROVIDER_SITE_OTHER): Payer: BLUE CROSS/BLUE SHIELD | Admitting: Physician Assistant

## 2018-05-02 VITALS — BP 154/90 | HR 92 | Ht 68.0 in | Wt 227.0 lb

## 2018-05-02 DIAGNOSIS — R079 Chest pain, unspecified: Secondary | ICD-10-CM

## 2018-05-02 DIAGNOSIS — R519 Headache, unspecified: Secondary | ICD-10-CM

## 2018-05-02 DIAGNOSIS — K2100 Gastro-esophageal reflux disease with esophagitis, without bleeding: Secondary | ICD-10-CM

## 2018-05-02 DIAGNOSIS — R51 Headache: Secondary | ICD-10-CM | POA: Diagnosis not present

## 2018-05-02 DIAGNOSIS — I10 Essential (primary) hypertension: Secondary | ICD-10-CM | POA: Diagnosis not present

## 2018-05-02 MED ORDER — ORPHENADRINE CITRATE ER 100 MG PO TB12: 100.0000 mg | ORAL_TABLET | Freq: Two times a day (BID) | ORAL | 0 refills | Status: DC

## 2018-05-02 NOTE — Patient Instructions (Signed)
Lisinopril double to 20mg  daily.

## 2018-05-02 NOTE — Progress Notes (Signed)
Subjective:    Patient ID: Devin Kim, male    DOB: [birth year], 22 y.o.   MRN: [id number]  HPI  Pt is a 22 yo male with HTN, migraines, GAD who presents to the clinic with some sharp chest pains that started yesterday. He admits a coworker just had a MI and he is on edge. Pains are in the right side of chest. Nothing seems to make better and nothing worse. Pains are not constant but off and on. No increase in pain with breathing. No cough or SOB. No URI symptoms. He has had some chest tightness but not with exertion.  He does have some hx of acid reflux but not taking omeprazole regularly. No stool changes or hematochezia or melena. He has some anxiety that is using marijuanna for. He has had some headaches.   .. Active Ambulatory Problems    Diagnosis Date Noted  . Hypothyroidism 08/09/2013  . Right upper quadrant pain 08/12/2013  . Nausea with vomiting 08/12/2013  . Seizures (HCC) 08/30/2013  . Anal fissure 10/21/2013  . Essential hypertension, benign 08/27/2014  . GAD (generalized anxiety disorder) 08/27/2014  . High risk sexual behavior 08/27/2014  . Tobacco dependence in remission 08/10/2015  . High risk bisexual behavior 08/26/2016  . Erectile dysfunction 08/26/2016  . Frequent headaches 08/26/2016  . Thrombosed hemorrhoids 03/24/2017  . Tachycardia determined by examination of pulse 06/01/2017  . Night sweats 11/23/2017  . Elevated liver enzymes 11/24/2017  . Hypertriglyceridemia 11/24/2017  . Elevated LDL cholesterol level 11/24/2017  . Indigestion 02/27/2018  . Migraine without aura and without status migrainosus, not intractable 02/27/2018  . Chronic neck pain 03/27/2018   Resolved Ambulatory Problems    Diagnosis Date Noted  . Tobacco abuse 08/09/2013  . Tobacco abuse 11/22/2013  . Syphilis 11/25/2013   Past Medical History:  Diagnosis Date  . Hypertension   . Thyroid disease       Review of Systems See HPI.     Objective:   Physical Exam   Constitutional: He is oriented to person, place, and time. He appears well-developed and well-nourished.  HENT:  Head: Atraumatic.  Cardiovascular: Normal rate, regular rhythm and normal heart sounds.  Pulmonary/Chest: Effort normal and breath sounds normal. No stridor. He exhibits no tenderness.  Abdominal: Soft. Bowel sounds are normal. He exhibits no mass. There is no tenderness. There is no rebound.  Neurological: He is alert and oriented to person, place, and time.  Skin: No rash noted.  Psychiatric: He has a normal mood and affect. His behavior is normal.          Assessment & Plan:  Marland KitchenMarland KitchenDiagnoses and all orders for this visit:  Chest pain, unspecified type -     EKG 12-Lead -     Troponin I  Essential hypertension, benign  Frequent headaches  Gastroesophageal reflux disease with esophagitis  Other orders -     orphenadrine (NORFLEX) 100 MG tablet; Take 1 tablet (100 mg total) by mouth 2 (two) times daily.   Unclear etiology. Do not suspect cardiac. GERD/gastritis vs muscle spasm vs anxiety. EKG no acute changes from last EKG. No ST elevation or arrhthymias.  Troponin ordered.  GI cocktail given. Restart omeprazole daily.  BP elevated increased lisinopril to 20mg  daily. Could be the cause of some headaches.  Norflex given as muscle relaxer to start. Sedation warning discussed. Use heat over area. Consider stretches.  Pt declines any intervention for anxiety.  If no improvement or continues to have sharp pain  will consider stress test.   Follow up in 2 weeks.

## 2018-05-04 ENCOUNTER — Encounter: Payer: Self-pay | Admitting: Physician Assistant

## 2018-05-04 MED ORDER — LISINOPRIL 20 MG PO TABS: 20.0000 mg | ORAL_TABLET | Freq: Every day | ORAL | 1 refills | Status: DC

## 2018-05-08 DIAGNOSIS — M9901 Segmental and somatic dysfunction of cervical region: Secondary | ICD-10-CM | POA: Diagnosis not present

## 2018-05-08 DIAGNOSIS — M9903 Segmental and somatic dysfunction of lumbar region: Secondary | ICD-10-CM | POA: Diagnosis not present

## 2018-05-08 DIAGNOSIS — M530 Cervicocranial syndrome: Secondary | ICD-10-CM | POA: Diagnosis not present

## 2018-05-08 DIAGNOSIS — M9902 Segmental and somatic dysfunction of thoracic region: Secondary | ICD-10-CM | POA: Diagnosis not present

## 2018-05-16 ENCOUNTER — Encounter: Payer: Self-pay | Admitting: Physician Assistant

## 2018-05-16 ENCOUNTER — Ambulatory Visit (INDEPENDENT_AMBULATORY_CARE_PROVIDER_SITE_OTHER): Payer: BLUE CROSS/BLUE SHIELD | Admitting: Physician Assistant

## 2018-05-16 VITALS — BP 128/88 | HR 78 | Ht 68.0 in | Wt 222.0 lb

## 2018-05-16 DIAGNOSIS — R519 Headache, unspecified: Secondary | ICD-10-CM

## 2018-05-16 DIAGNOSIS — I10 Essential (primary) hypertension: Secondary | ICD-10-CM | POA: Diagnosis not present

## 2018-05-16 DIAGNOSIS — G43009 Migraine without aura, not intractable, without status migrainosus: Secondary | ICD-10-CM

## 2018-05-16 DIAGNOSIS — R0789 Other chest pain: Secondary | ICD-10-CM | POA: Diagnosis not present

## 2018-05-16 DIAGNOSIS — R51 Headache: Secondary | ICD-10-CM

## 2018-05-16 DIAGNOSIS — F411 Generalized anxiety disorder: Secondary | ICD-10-CM

## 2018-05-16 DIAGNOSIS — G8929 Other chronic pain: Secondary | ICD-10-CM

## 2018-05-16 MED ORDER — ERENUMAB-AOOE 140 MG/ML ~~LOC~~ SOAJ: 1.0000 "pen " | SUBCUTANEOUS | 2 refills | Status: DC

## 2018-05-16 MED ORDER — LISINOPRIL 20 MG PO TABS: 20.0000 mg | ORAL_TABLET | Freq: Every day | ORAL | 1 refills | Status: DC

## 2018-05-16 NOTE — Progress Notes (Signed)
Subjective:    Patient ID: Devin Kim, male    DOB: 08/16/1995, 22 y.o.   MRN: [id number]  HPI  Pt is a 22 yo male with HTN, migraines, hypothyroidism, seizures who presents to the clinic for 1 month follow up on atypical chest pain.   His CP has resolved. He did not notice very much relief with GI cocktail in the office and does not feel like he is having reflux issues effecting CP.   His headaches are better not as severe but still daily. 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 He is very stressed. He has recently been written up and this makes him worried for his job. With headache he has sensitivity to light and some nausea. He is taking trokendi daily. He does feel like muscle relaxer has helped some as well.   .. Active Ambulatory Problems    Diagnosis Date Noted  . Hypothyroidism 08/09/2013  . Right upper quadrant pain 08/12/2013  . Nausea with vomiting 08/12/2013  . Seizures (HCC) 08/30/2013  . Anal fissure 10/21/2013  . Essential hypertension, benign 08/27/2014  . GAD (generalized anxiety disorder) 08/27/2014  . High risk sexual behavior 08/27/2014  . Tobacco dependence in remission 08/10/2015  . High risk bisexual behavior 08/26/2016  . Erectile dysfunction 08/26/2016  . Frequent headaches 08/26/2016  . Thrombosed hemorrhoids 03/24/2017  . Tachycardia determined by examination of pulse 06/01/2017  . Night sweats 11/23/2017  . Elevated liver enzymes 11/24/2017  . Hypertriglyceridemia 11/24/2017  . Elevated LDL cholesterol level 11/24/2017  . Indigestion 02/27/2018  . Migraine without aura and without status migrainosus, not intractable 02/27/2018  . Chronic neck pain 03/27/2018  . Atypical chest pain 05/21/2018   Resolved Ambulatory Problems    Diagnosis Date Noted  . Tobacco abuse 08/09/2013  . Tobacco abuse 11/22/2013  . Syphilis 11/25/2013   Past Medical History:   Diagnosis Date  . Hypertension   . Thyroid disease        Review of Systems See HPI.     Objective:   Physical Exam  Constitutional: He is oriented to person, place, and time. He appears well-developed and well-nourished.  HENT:  Head: Normocephalic and atraumatic.  Cardiovascular: Normal rate and regular rhythm.  Pulmonary/Chest: Effort normal and breath sounds normal.  Neurological: He is alert and oriented to person, place, and time.  Psychiatric: He has a normal mood and affect. His behavior is normal.          Assessment & Plan:  Marland KitchenMarland KitchenDiagnoses and all orders for this visit:  Migraine without aura and without status migrainosus, not intractable -     Erenumab-aooe (AIMOVIG) 140 MG/ML SOAJ; Inject 1 pen into the skin every 30 (thirty) days.  Frequent headaches  Atypical chest pain  Essential hypertension -     lisinopril (PRINIVIL,ZESTRIL) 20 MG tablet; Take 1 tablet (20 mg total) by mouth daily.  Chronic nonintractable headache, unspecified headache type -     CT Head Wo Contrast  GAD (generalized anxiety disorder)  CP resolved.   BP on 2nd recheck looks great. Continue on lisinopril 20mg .   Since headaches seem to be what is causing work decline and then increasing anxiety would like to start with migraine prevention.  Add aimovig for migraine. Discussed medication. Coupon card given. Follow up in 2 months. Continue on trokendi for now. HA fairly consistent for last 3 months. Ordered CT of head.  Discussed anxiety and ways to manage this. Pt declined medication. Follow up in 2 months or sooner if needed.   Marland Kitchen..Spent 30 minutes with patient and greater than 50 percent of visit spent counseling patient regarding treatment plan.

## 2018-05-21 DIAGNOSIS — R0789 Other chest pain: Secondary | ICD-10-CM | POA: Insufficient documentation

## 2018-05-27 ENCOUNTER — Other Ambulatory Visit: Payer: Self-pay | Admitting: Physician Assistant

## 2018-05-27 DIAGNOSIS — G43009 Migraine without aura, not intractable, without status migrainosus: Secondary | ICD-10-CM

## 2018-05-28 ENCOUNTER — Ambulatory Visit (INDEPENDENT_AMBULATORY_CARE_PROVIDER_SITE_OTHER): Payer: BLUE CROSS/BLUE SHIELD

## 2018-05-28 DIAGNOSIS — R51 Headache: Secondary | ICD-10-CM

## 2018-05-28 DIAGNOSIS — G8929 Other chronic pain: Secondary | ICD-10-CM | POA: Diagnosis not present

## 2018-05-28 NOTE — Progress Notes (Signed)
Call pt: GREAT news. Normal CT of head.

## 2018-05-29 DIAGNOSIS — M9902 Segmental and somatic dysfunction of thoracic region: Secondary | ICD-10-CM | POA: Diagnosis not present

## 2018-05-29 DIAGNOSIS — M530 Cervicocranial syndrome: Secondary | ICD-10-CM | POA: Diagnosis not present

## 2018-05-29 DIAGNOSIS — M9903 Segmental and somatic dysfunction of lumbar region: Secondary | ICD-10-CM | POA: Diagnosis not present

## 2018-05-29 DIAGNOSIS — M9901 Segmental and somatic dysfunction of cervical region: Secondary | ICD-10-CM | POA: Diagnosis not present

## 2018-06-04 ENCOUNTER — Telehealth: Payer: Self-pay | Admitting: Family Medicine

## 2018-06-04 NOTE — Telephone Encounter (Signed)
Message from [employer] ([employer]) [id number];XBJYNW:GNFAOZHYCaseId:[id number];Status:Approved;Review Type:Prior Auth;Coverage Start Date:05/05/2018;Coverage End Date:06/04/2019; Pharmacy aware.

## 2018-06-11 DIAGNOSIS — M9901 Segmental and somatic dysfunction of cervical region: Secondary | ICD-10-CM | POA: Diagnosis not present

## 2018-06-11 DIAGNOSIS — M9902 Segmental and somatic dysfunction of thoracic region: Secondary | ICD-10-CM | POA: Diagnosis not present

## 2018-06-11 DIAGNOSIS — M530 Cervicocranial syndrome: Secondary | ICD-10-CM | POA: Diagnosis not present

## 2018-06-11 DIAGNOSIS — M9903 Segmental and somatic dysfunction of lumbar region: Secondary | ICD-10-CM | POA: Diagnosis not present

## 2018-06-12 ENCOUNTER — Other Ambulatory Visit: Payer: Self-pay | Admitting: Physician Assistant

## 2018-06-13 ENCOUNTER — Ambulatory Visit: Payer: BLUE CROSS/BLUE SHIELD | Admitting: Physician Assistant

## 2018-07-10 DIAGNOSIS — M530 Cervicocranial syndrome: Secondary | ICD-10-CM | POA: Diagnosis not present

## 2018-07-10 DIAGNOSIS — M9901 Segmental and somatic dysfunction of cervical region: Secondary | ICD-10-CM | POA: Diagnosis not present

## 2018-07-10 DIAGNOSIS — M9902 Segmental and somatic dysfunction of thoracic region: Secondary | ICD-10-CM | POA: Diagnosis not present

## 2018-07-10 DIAGNOSIS — M9903 Segmental and somatic dysfunction of lumbar region: Secondary | ICD-10-CM | POA: Diagnosis not present

## 2018-07-24 DIAGNOSIS — M9902 Segmental and somatic dysfunction of thoracic region: Secondary | ICD-10-CM | POA: Diagnosis not present

## 2018-07-24 DIAGNOSIS — M530 Cervicocranial syndrome: Secondary | ICD-10-CM | POA: Diagnosis not present

## 2018-07-24 DIAGNOSIS — M9903 Segmental and somatic dysfunction of lumbar region: Secondary | ICD-10-CM | POA: Diagnosis not present

## 2018-07-24 DIAGNOSIS — M9901 Segmental and somatic dysfunction of cervical region: Secondary | ICD-10-CM | POA: Diagnosis not present

## 2018-08-07 DIAGNOSIS — M9903 Segmental and somatic dysfunction of lumbar region: Secondary | ICD-10-CM | POA: Diagnosis not present

## 2018-08-07 DIAGNOSIS — M9901 Segmental and somatic dysfunction of cervical region: Secondary | ICD-10-CM | POA: Diagnosis not present

## 2018-08-07 DIAGNOSIS — M9902 Segmental and somatic dysfunction of thoracic region: Secondary | ICD-10-CM | POA: Diagnosis not present

## 2018-08-07 DIAGNOSIS — M530 Cervicocranial syndrome: Secondary | ICD-10-CM | POA: Diagnosis not present

## 2018-08-28 DIAGNOSIS — M9901 Segmental and somatic dysfunction of cervical region: Secondary | ICD-10-CM | POA: Diagnosis not present

## 2018-08-28 DIAGNOSIS — M9902 Segmental and somatic dysfunction of thoracic region: Secondary | ICD-10-CM | POA: Diagnosis not present

## 2018-08-28 DIAGNOSIS — M530 Cervicocranial syndrome: Secondary | ICD-10-CM | POA: Diagnosis not present

## 2018-08-28 DIAGNOSIS — M9903 Segmental and somatic dysfunction of lumbar region: Secondary | ICD-10-CM | POA: Diagnosis not present

## 2018-09-06 ENCOUNTER — Encounter: Payer: Self-pay | Admitting: Physician Assistant

## 2018-09-18 DIAGNOSIS — M9902 Segmental and somatic dysfunction of thoracic region: Secondary | ICD-10-CM | POA: Diagnosis not present

## 2018-09-18 DIAGNOSIS — M9901 Segmental and somatic dysfunction of cervical region: Secondary | ICD-10-CM | POA: Diagnosis not present

## 2018-09-18 DIAGNOSIS — M530 Cervicocranial syndrome: Secondary | ICD-10-CM | POA: Diagnosis not present

## 2018-09-18 DIAGNOSIS — M9903 Segmental and somatic dysfunction of lumbar region: Secondary | ICD-10-CM | POA: Diagnosis not present

## 2018-10-06 ENCOUNTER — Other Ambulatory Visit: Payer: Self-pay | Admitting: Physician Assistant

## 2018-10-23 DIAGNOSIS — M9902 Segmental and somatic dysfunction of thoracic region: Secondary | ICD-10-CM | POA: Diagnosis not present

## 2018-10-23 DIAGNOSIS — M530 Cervicocranial syndrome: Secondary | ICD-10-CM | POA: Diagnosis not present

## 2018-10-23 DIAGNOSIS — M9903 Segmental and somatic dysfunction of lumbar region: Secondary | ICD-10-CM | POA: Diagnosis not present

## 2018-10-23 DIAGNOSIS — M9901 Segmental and somatic dysfunction of cervical region: Secondary | ICD-10-CM | POA: Diagnosis not present

## 2018-11-06 DIAGNOSIS — M530 Cervicocranial syndrome: Secondary | ICD-10-CM | POA: Diagnosis not present

## 2018-11-06 DIAGNOSIS — M9903 Segmental and somatic dysfunction of lumbar region: Secondary | ICD-10-CM | POA: Diagnosis not present

## 2018-11-06 DIAGNOSIS — M9901 Segmental and somatic dysfunction of cervical region: Secondary | ICD-10-CM | POA: Diagnosis not present

## 2018-11-06 DIAGNOSIS — M9902 Segmental and somatic dysfunction of thoracic region: Secondary | ICD-10-CM | POA: Diagnosis not present

## 2018-11-10 ENCOUNTER — Other Ambulatory Visit: Payer: Self-pay | Admitting: Physician Assistant

## 2018-11-12 ENCOUNTER — Encounter: Payer: BLUE CROSS/BLUE SHIELD | Admitting: Physician Assistant

## 2018-11-19 ENCOUNTER — Other Ambulatory Visit: Payer: Self-pay | Admitting: Physician Assistant

## 2018-11-19 DIAGNOSIS — K30 Functional dyspepsia: Secondary | ICD-10-CM

## 2018-11-20 DIAGNOSIS — M9901 Segmental and somatic dysfunction of cervical region: Secondary | ICD-10-CM | POA: Diagnosis not present

## 2018-11-20 DIAGNOSIS — M9902 Segmental and somatic dysfunction of thoracic region: Secondary | ICD-10-CM | POA: Diagnosis not present

## 2018-11-20 DIAGNOSIS — M9903 Segmental and somatic dysfunction of lumbar region: Secondary | ICD-10-CM | POA: Diagnosis not present

## 2018-11-20 DIAGNOSIS — M530 Cervicocranial syndrome: Secondary | ICD-10-CM | POA: Diagnosis not present

## 2018-12-11 ENCOUNTER — Encounter: Payer: BLUE CROSS/BLUE SHIELD | Admitting: Physician Assistant

## 2018-12-15 ENCOUNTER — Other Ambulatory Visit: Payer: Self-pay | Admitting: Physician Assistant

## 2018-12-17 ENCOUNTER — Encounter: Payer: Self-pay | Admitting: Physician Assistant

## 2018-12-17 ENCOUNTER — Ambulatory Visit (INDEPENDENT_AMBULATORY_CARE_PROVIDER_SITE_OTHER): Payer: BC Managed Care – PPO | Admitting: Physician Assistant

## 2018-12-17 VITALS — BP 138/85 | HR 98 | Temp 98.9°F | Ht 68.0 in | Wt 226.0 lb

## 2018-12-17 DIAGNOSIS — Z0000 Encounter for general adult medical examination without abnormal findings: Secondary | ICD-10-CM | POA: Diagnosis not present

## 2018-12-17 DIAGNOSIS — I10 Essential (primary) hypertension: Secondary | ICD-10-CM

## 2018-12-17 DIAGNOSIS — IMO0001 Reserved for inherently not codable concepts without codable children: Secondary | ICD-10-CM

## 2018-12-17 DIAGNOSIS — Z131 Encounter for screening for diabetes mellitus: Secondary | ICD-10-CM | POA: Diagnosis not present

## 2018-12-17 DIAGNOSIS — Z13220 Encounter for screening for lipoid disorders: Secondary | ICD-10-CM

## 2018-12-17 DIAGNOSIS — E6609 Other obesity due to excess calories: Secondary | ICD-10-CM

## 2018-12-17 DIAGNOSIS — K649 Unspecified hemorrhoids: Secondary | ICD-10-CM | POA: Diagnosis not present

## 2018-12-17 DIAGNOSIS — E039 Hypothyroidism, unspecified: Secondary | ICD-10-CM | POA: Diagnosis not present

## 2018-12-17 DIAGNOSIS — G43009 Migraine without aura, not intractable, without status migrainosus: Secondary | ICD-10-CM | POA: Diagnosis not present

## 2018-12-17 DIAGNOSIS — R5383 Other fatigue: Secondary | ICD-10-CM | POA: Diagnosis not present

## 2018-12-17 DIAGNOSIS — Z6834 Body mass index (BMI) 34.0-34.9, adult: Secondary | ICD-10-CM

## 2018-12-17 DIAGNOSIS — F529 Unspecified sexual dysfunction not due to a substance or known physiological condition: Secondary | ICD-10-CM

## 2018-12-17 DIAGNOSIS — E66811 Obesity, class 1: Secondary | ICD-10-CM

## 2018-12-17 MED ORDER — RIZATRIPTAN BENZOATE 10 MG PO TABS: 10.0000 mg | ORAL_TABLET | ORAL | 5 refills | Status: DC | PRN

## 2018-12-17 MED ORDER — PHENTERMINE HCL 37.5 MG PO TABS: 37.5000 mg | ORAL_TABLET | Freq: Every day | ORAL | 0 refills | Status: DC

## 2018-12-17 MED ORDER — LISINOPRIL 20 MG PO TABS: 20.0000 mg | ORAL_TABLET | Freq: Every day | ORAL | 1 refills | Status: DC

## 2018-12-17 MED ORDER — TOPIRAMATE ER 200 MG PO CAP24: ORAL_CAPSULE | ORAL | 5 refills | Status: DC

## 2018-12-17 MED ORDER — LEVOTHYROXINE SODIUM 75 MCG PO TABS: 75.0000 ug | ORAL_TABLET | Freq: Every day | ORAL | 5 refills | Status: DC

## 2018-12-17 MED ORDER — PRAMOXINE HCL (PERIANAL) 1 % EX FOAM: 1.0000 "application " | Freq: Three times a day (TID) | CUTANEOUS | 1 refills | Status: DC | PRN

## 2018-12-17 NOTE — Progress Notes (Signed)
Subjective:    Patient ID: Devin Kim, male    DOB: 05/07/1996, 23 y.o.   MRN: [id number]  HPI  Pt is a 23 yo male who presents to for annual exam.   He would like to talk about his sexual orientation. He has been thinking about this a lot. He has always been attracted to both men and women. He the last few years he has had more interaction with men and wonders if his sexual orientation has changed. He also feels like physical factors are making him lack self confidence last hemorrhoids and weight. He struggles with bad hemorrhoids from time to time. Exercise makes worse and so does anal sex.   He does admit that he is having more migraines. He is on trokendi 100mg  daily. He averages 2 migraines a week.           .. Active Ambulatory Problems    Diagnosis Date Noted  . Hypothyroidism 08/09/2013  . Right upper quadrant pain 08/12/2013  . Nausea with vomiting 08/12/2013  . Seizures (HCC) 08/30/2013  . Anal fissure 10/21/2013  . Essential hypertension, benign 08/27/2014  . GAD (generalized anxiety disorder) 08/27/2014  . High risk sexual behavior 08/27/2014  . Tobacco dependence in remission 08/10/2015  . High risk bisexual behavior 08/26/2016  . Erectile dysfunction 08/26/2016  . Frequent headaches 08/26/2016  . Thrombosed hemorrhoids 03/24/2017  . Tachycardia determined by examination of pulse 06/01/2017  . Night sweats 11/23/2017  . Elevated liver enzymes 11/24/2017  . Hypertriglyceridemia 11/24/2017  . Elevated LDL cholesterol level 11/24/2017  . Indigestion 02/27/2018  . Migraine without aura and without status migrainosus, not intractable 02/27/2018  . Chronic neck pain 03/27/2018  . Atypical chest pain 05/21/2018  . Sexuality issues 12/21/2018  . Class 1 obesity due to excess calories without serious comorbidity with body mass index (BMI) of 34.0 to 34.9 in adult 12/21/2018   Resolved Ambulatory Problems    Diagnosis Date Noted  . Tobacco abuse 08/09/2013  . Tobacco  abuse 11/22/2013  . Syphilis 11/25/2013   Past Medical History:  Diagnosis Date  . Hypertension   . Thyroid disease    .Marland Kitchen. Family History  Problem Relation Age of Onset  . Alcohol abuse Father   . Hypertension Father   . Diabetes Paternal Uncle   . Cancer Paternal Grandmother   . Hypertension Paternal Grandfather    .Marland Kitchen. Social History   Socioeconomic History  . Marital status: Single    Spouse name: Not on file  . Number of children: Not on file  . Years of education: Not on file  . Highest education level: Not on file  Occupational History  . Not on file  Social Needs  . Financial resource strain: Not on file  . Food insecurity    Worry: Not on file    Inability: Not on file  . Transportation needs    Medical: Not on file    Non-medical: Not on file  Tobacco Use  . Smoking status: Former Smoker    Start date: 08/26/2013  . Smokeless tobacco: Never Used  Substance and Sexual Activity  . Alcohol use: No    Alcohol/week: 0.0 standard drinks  . Drug use: No  . Sexual activity: Not Currently  Lifestyle  . Physical activity    Days per week: Not on file    Minutes per session: Not on file  . Stress: Not on file  Relationships  . Social Musicianconnections    Talks on  phone: Not on file    Gets together: Not on file    Attends religious service: Not on file    Active member of club or organization: Not on file    Attends meetings of clubs or organizations: Not on file    Relationship status: Not on file  . Intimate partner violence    Fear of current or ex partner: Not on file    Emotionally abused: Not on file    Physically abused: Not on file    Forced sexual activity: Not on file  Other Topics Concern  . Not on file  Social History Narrative  . Not on file       Review of Systems  All other systems reviewed and are negative.      Objective:   Physical Exam  BP 138/85   Pulse 98   Temp 98.9 F (37.2 C) (Oral)   Ht 5\' 8"  (1.727 m)   Wt 226 lb (102.5  kg)   SpO2 95%   BMI 34.36 kg/m   General Appearance:    Alert, cooperative, no distress, appears stated age  Head:    Normocephalic, without obvious abnormality, atraumatic  Eyes:    PERRL, conjunctiva/corneas clear, EOM's intact, fundi    benign, both eyes       Ears:    Normal TM's and external ear canals, both ears  Nose:   Nares normal, septum midline, mucosa normal, no drainage    or sinus tenderness  Throat:   Lips, mucosa, and tongue normal; teeth and gums normal  Neck:   Supple, symmetrical, trachea midline, no adenopathy;       thyroid:  No enlargement/tenderness/nodules; no carotid   bruit or JVD  Back:     Symmetric, no curvature, ROM normal, no CVA tenderness  Lungs:     Clear to auscultation bilaterally, respirations unlabored  Chest wall:    No tenderness or deformity  Heart:    Regular rate and rhythm, S1 and S2 normal, no murmur, rub   or gallop  Abdomen:     Soft, non-tender, bowel sounds active all four quadrants,    no masses, no organomegaly        Extremities:   Extremities normal, atraumatic, no cyanosis or edema  Pulses:   2+ and symmetric all extremities  Skin:   Skin color, texture, turgor normal, no rashes or lesions  Lymph nodes:   Cervical, supraclavicular, and axillary nodes normal  Neurologic:   CNII-XII intact. Normal strength, sensation and reflexes      throughout        Assessment & Plan:  Marland Kitchen.Marland Kitchen.Devin Kim was seen today for annual exam.  Diagnoses and all orders for this visit:  Routine physical examination  Hemorrhoids, unspecified hemorrhoid type -     pramoxine (PROCTOFOAM) 1 % foam; Place 1 application rectally 3 (three) times daily as needed for anal itching, anal irritation or hemorrhoids. -     Ambulatory referral to Gastroenterology  Migraine without aura and without status migrainosus, not intractable -     rizatriptan (MAXALT) 10 MG tablet; Take 1 tablet (10 mg total) by mouth as needed for migraine. May repeat in 2 hours if needed -      Topiramate ER (TROKENDI XR) 200 MG CP24; Take one tablet daily.  Essential hypertension -     COMPLETE METABOLIC PANEL WITH GFR -     lisinopril (ZESTRIL) 20 MG tablet; Take 1 tablet (20 mg total) by mouth daily.  Low energy -     Testosterone  Screening for diabetes mellitus -     COMPLETE METABOLIC PANEL WITH GFR  Acquired hypothyroidism -     TSH -     levothyroxine (SYNTHROID) 75 MCG tablet; Take 1 tablet (75 mcg total) by mouth daily before breakfast.  Screening for lipid disorders -     Lipid Panel w/reflex Direct LDL -     DIRECT LDL  Sexuality issues -     Ambulatory referral to Psychology  Class 1 obesity due to excess calories without serious comorbidity with body mass index (BMI) of 34.0 to 34.9 in adult -     phentermine (ADIPEX-P) 37.5 MG tablet; Take 1 tablet (37.5 mg total) by mouth daily before breakfast.  Essential hypertension, benign  Other orders -     Discontinue: pramoxine (PROCTOFOAM) 1 % foam; Place 1 application rectally 3 (three) times daily as needed for anal itching, anal irritation or hemorrhoids.   .. Depression screen [facility] 2/9 12/17/2018 05/16/2018 02/27/2018 11/22/2017 10/05/2016  Decreased Interest 0 1 0 0 0  Down, Depressed, Hopeless 0 1 0 0 0  PHQ - 2 Score 0 2 0 0 0  Altered sleeping 0 0 0 - -  Tired, decreased energy 0 0 0 - -  Change in appetite 0 0 0 - -  Feeling bad or failure about yourself  0 0 0 - -  Trouble concentrating 0 0 0 - -  Moving slowly or fidgety/restless 0 0 0 - -  Suicidal thoughts - 0 0 - -  PHQ-9 Score 0 2 0 - -  Difficult doing work/chores Not difficult at all Somewhat difficult - - -   .Marland KitchenStart a regular exercise program and make sure you are eating a healthy diet Try to eat 4 servings of dairy a day or take a calcium supplement (500mg  twice a day). Your vaccines are up to date.  Fasting labs ordered.   Will make medication adjustments as needed.   Increased trokendi to 200mg  daily for migraines. maxalt for  rescue.   Marland Kitchen.Discussed low carb diet with 1500 calories and 80g of protein.  Exercising at least 150 minutes a week.  [employer] could be a Microbiologist.  Added phentermine. Start with 1/2 tablet. Discussed side effects. Follow up in 1 month.   Discussed exercise better for not aggravating hemorrhoids.   Given cream for hemorrhoids to use as needed. Discussed stool softers and diet to make better.  Discussed GI referral for banding.   Discussed sexual orientation. Recommended counseling before he "comes out". Encouraged him to be confident in his decision.

## 2018-12-17 NOTE — Patient Instructions (Addendum)
Keeping you healthy  Get these tests  Blood pressure- Have your blood pressure checked once a year by your healthcare provider.  Normal blood pressure is 120/80.  Weight- Have your body mass index (BMI) calculated to screen for obesity.  BMI is a measure of body fat based on height and weight. You can also calculate your own BMI at [employer]velBags.it.  Cholesterol- Have your cholesterol checked regularly starting at age 25, sooner may be necessary if you have diabetes, high blood pressure, if a family member developed heart diseases at an early age or if you smoke.   Chlamydia, HIV, and other sexual transmitted disease- Get screened each year until the age of 39 then within three months of each new sexual partner.  Diabetes- Have your blood sugar checked regularly if you have high blood pressure, high cholesterol, a family history of diabetes or if you are overweight.  Get these vaccines  Flu shot- Every fall.  Tetanus shot- Every 10 years.  Menactra- Single dose; prevents meningitis.  Take these steps  Don't smoke- If you do smoke, ask your healthcare provider about quitting. For tips on how to quit, go to www.smokefree.gov or call 1-800-QUIT-NOW.  Be physically active- Exercise 5 days a week for at least 30 minutes.  If you are not already physically active start slow and gradually work up to 30 minutes of moderate physical activity.  Examples of moderate activity include walking briskly, mowing the yard, dancing, swimming bicycling, etc.  Eat a healthy diet- Eat a variety of healthy foods such as fruits, vegetables, low fat milk, low fat cheese, yogurt, lean meats, poultry, fish, beans, tofu, etc.  For more information on healthy eating, go to www.thenutritionsource.org  Drink alcohol in moderation- Limit alcohol intake two drinks or less a day.  Never drink and drive.  Dentist- Brush and floss teeth twice daily; visit your dentis twice a year.  Depression-Your  emotional health is as important as your physical health.  If you're feeling down, losing interest in things you normally enjoy please talk with your healthcare provider.  Gun Safety- If you keep a gun in your home, keep it unloaded and with the safety lock on.  Bullets should be stored separately.  Helmet use- Always wear a helmet when riding a motorcycle, bicycle, rollerblading or skateboarding.  Safe sex- If you may be exposed to a sexually transmitted infection, use a condom  Seat belts- Seat bels can save your life; always wear one.  Smoke/Carbon Monoxide detectors- These detectors need to be installed on the appropriate level of your home.  Replace batteries at least once a year.  Skin Cancer- When out in the sun, cover up and use sunscreen SPF 15 or higher. Violence- If anyone is threatening or hurting you, please tell your healthcare provider.Hemorrhoids Hemorrhoids are swollen veins in and around the rectum or anus. There are two types of hemorrhoids:  Internal hemorrhoids. These occur in the veins that are just inside the rectum. They may poke through to the outside and become irritated and painful.  External hemorrhoids. These occur in the veins that are outside the anus and can be felt as a painful swelling or hard lump near the anus. Most hemorrhoids do not cause serious problems, and they can be managed with home treatments such as diet and lifestyle changes. If home treatments do not help the symptoms, procedures can be done to shrink or remove the hemorrhoids. What are the causes? This condition is caused by increased pressure  in the anal area. This pressure may result from various things, including:  Constipation.  Straining to have a bowel movement.  Diarrhea.  Pregnancy.  Obesity.  Sitting for long periods of time.  Heavy lifting or other activity that causes you to strain.  Anal sex.  Riding a bike for a long period of time. What are the signs or symptoms?  Symptoms of this condition include:  Pain.  Anal itching or irritation.  Rectal bleeding.  Leakage of stool (feces).  Anal swelling.  One or more lumps around the anus. How is this diagnosed? This condition can often be diagnosed through a visual exam. Other exams or tests may also be done, such as:  An exam that involves feeling the rectal area with a gloved hand (digital rectal exam).  An exam of the anal canal that is done using a small tube (anoscope).  A blood test, if you have lost a significant amount of blood.  A test to look inside the colon using a flexible tube with a camera on the end (sigmoidoscopy or colonoscopy). How is this treated? This condition can usually be treated at home. However, various procedures may be done if dietary changes, lifestyle changes, and other home treatments do not help your symptoms. These procedures can help make the hemorrhoids smaller or remove them completely. Some of these procedures involve surgery, and others do not. Common procedures include:  Rubber band ligation. Rubber bands are placed at the base of the hemorrhoids to cut off their blood supply.  Sclerotherapy. Medicine is injected into the hemorrhoids to shrink them.  Infrared coagulation. A type of light energy is used to get rid of the hemorrhoids.  Hemorrhoidectomy surgery. The hemorrhoids are surgically removed, and the veins that supply them are tied off.  Stapled hemorrhoidopexy surgery. The surgeon staples the base of the hemorrhoid to the rectal wall. Follow these instructions at home: Eating and drinking   Eat foods that have a lot of fiber in them, such as whole grains, beans, nuts, fruits, and vegetables.  Ask your health care provider about taking products that have added fiber (fiber supplements).  Reduce the amount of fat in your diet. You can do this by eating low-fat dairy products, eating less red meat, and avoiding processed foods.  Drink enough  fluid to keep your urine pale yellow. Managing pain and swelling   Take warm sitz baths for 20 minutes, 3-4 times a day to ease pain and discomfort. You may do this in a bathtub or using a portable sitz bath that fits over the toilet.  If directed, apply ice to the affected area. Using ice packs between sitz baths may be helpful. ? Put ice in a plastic bag. ? Place a towel between your skin and the bag. ? Leave the ice on for 20 minutes, 2-3 times a day. General instructions  Take over-the-counter and prescription medicines only as told by your health care provider.  Use medicated creams or suppositories as told.  Get regular exercise. Ask your health care provider how much and what kind of exercise is best for you. In general, you should do moderate exercise for at least 30 minutes on most days of the week (150 minutes each week). This can include activities such as walking, biking, or yoga.  Go to the bathroom when you have the urge to have a bowel movement. Do not wait.  Avoid straining to have bowel movements.  Keep the anal area dry and clean. Use  wet toilet paper or moist towelettes after a bowel movement.  Do not sit on the toilet for long periods of time. This increases blood pooling and pain.  Keep all follow-up visits as told by your health care provider. This is important. Contact a health care provider if you have:  Increasing pain and swelling that are not controlled by treatment or medicine.  Difficulty having a bowel movement, or you are unable to have a bowel movement.  Pain or inflammation outside the area of the hemorrhoids. Get help right away if you have:  Uncontrolled bleeding from your rectum. Summary  Hemorrhoids are swollen veins in and around the rectum or anus.  Most hemorrhoids can be managed with home treatments such as diet and lifestyle changes.  Taking warm sitz baths can help ease pain and discomfort.  In severe cases, procedures or surgery  can be done to shrink or remove the hemorrhoids. This information is not intended to replace advice given to you by your health care provider. Make sure you discuss any questions you have with your health care provider. Document Released: 06/10/2000 Document Revised: 11/02/2017 Document Reviewed: 11/02/2017 Elsevier Interactive Patient Education  2019 [employer]

## 2018-12-18 ENCOUNTER — Encounter: Payer: Self-pay | Admitting: Physician Assistant

## 2018-12-18 LAB — COMPLETE METABOLIC PANEL WITH GFR
AG Ratio: 2.3 (calc) (ref 1.0–2.5)
ALT: 69 U/L — ABNORMAL HIGH (ref 9–46)
AST: 44 U/L — ABNORMAL HIGH (ref 10–40)
Albumin: 5.1 g/dL (ref 3.6–5.1)
Alkaline phosphatase (APISO): 76 U/L (ref 36–130)
BUN: 19 mg/dL (ref 7–25)
CO2: 21 mmol/L (ref 20–32)
Calcium: 10.2 mg/dL (ref 8.6–10.3)
Chloride: 105 mmol/L (ref 98–110)
Creat: 1.3 mg/dL (ref 0.60–1.35)
GFR, Est African American: 88 mL/min/{1.73_m2} (ref >=60)
GFR, Est Non African American: 76 mL/min/{1.73_m2} (ref >=60)
Globulin: 2.2 g/dL (calc) (ref 1.9–3.7)
Glucose, Bld: 99 mg/dL (ref 65–99)
Potassium: 4.1 mmol/L (ref 3.5–5.3)
Sodium: 137 mmol/L (ref 135–146)
Total Bilirubin: 0.8 mg/dL (ref 0.2–1.2)
Total Protein: 7.3 g/dL (ref 6.1–8.1)

## 2018-12-18 LAB — DIRECT LDL: Direct LDL: 100 mg/dL — ABNORMAL HIGH (ref <100)

## 2018-12-18 LAB — LIPID PANEL W/REFLEX DIRECT LDL
Cholesterol: 188 mg/dL (ref <200)
HDL: 32 mg/dL — ABNORMAL LOW (ref >=40)
Non-HDL Cholesterol (Calc): 156 mg/dL (calc) — ABNORMAL HIGH (ref <130)
Total CHOL/HDL Ratio: 5.9 (calc) — ABNORMAL HIGH (ref <5.0)
Triglycerides: 437 mg/dL — ABNORMAL HIGH (ref <150)

## 2018-12-18 LAB — TESTOSTERONE: Testosterone: 294 ng/dL (ref 250–827)

## 2018-12-18 LAB — TSH: TSH: 3.08 mIU/L (ref 0.40–4.50)

## 2018-12-18 NOTE — Progress Notes (Signed)
Call pt:  Testosterone on low normal. I do think weight loss will help this a lot! You could also start a testosterone booster OTC.   Kidney, glucose look great.   Liver enzymes are up some could be due to weight gain. Will need to check in a month or so. Watch alcohol use.   TG are really high. Need to be on fish oil 4000mg  daily.

## 2018-12-21 ENCOUNTER — Encounter: Payer: Self-pay | Admitting: Physician Assistant

## 2018-12-21 ENCOUNTER — Telehealth: Payer: Self-pay

## 2018-12-21 DIAGNOSIS — E6609 Other obesity due to excess calories: Secondary | ICD-10-CM | POA: Insufficient documentation

## 2018-12-21 DIAGNOSIS — F529 Unspecified sexual dysfunction not due to a substance or known physiological condition: Secondary | ICD-10-CM | POA: Insufficient documentation

## 2018-12-21 DIAGNOSIS — Z6832 Body mass index (BMI) 32.0-32.9, adult: Secondary | ICD-10-CM | POA: Insufficient documentation

## 2018-12-21 DIAGNOSIS — IMO0001 Reserved for inherently not codable concepts without codable children: Secondary | ICD-10-CM | POA: Insufficient documentation

## 2018-12-21 MED ORDER — PRAMOXINE HCL (PERIANAL) 1 % EX FOAM: 1.0000 "application " | Freq: Three times a day (TID) | CUTANEOUS | 1 refills | Status: DC | PRN

## 2018-12-21 MED ORDER — PRAMOXINE-HC 1-2.5 % EX LOTN: 1.0000 "application " | TOPICAL_LOTION | Freq: Three times a day (TID) | CUTANEOUS | 3 refills | Status: DC

## 2018-12-21 NOTE — Telephone Encounter (Signed)
Ok sent!

## 2018-12-21 NOTE — Telephone Encounter (Signed)
Patient advised.

## 2018-12-21 NOTE — Telephone Encounter (Signed)
The pharmacy is out of Proctofoam and the cost is high. They recommend Hydrocortisone Ace Pramoxine 2.5%-1%. Pended order.

## 2018-12-25 DIAGNOSIS — K602 Anal fissure, unspecified: Secondary | ICD-10-CM | POA: Diagnosis not present

## 2018-12-25 DIAGNOSIS — K625 Hemorrhage of anus and rectum: Secondary | ICD-10-CM | POA: Diagnosis not present

## 2018-12-26 ENCOUNTER — Encounter: Payer: Self-pay | Admitting: Physician Assistant

## 2018-12-26 MED ORDER — TRAMADOL HCL 50 MG PO TABS: 50.0000 mg | ORAL_TABLET | Freq: Four times a day (QID) | ORAL | 0 refills | Status: AC | PRN

## 2019-01-21 ENCOUNTER — Ambulatory Visit: Payer: BC Managed Care – PPO | Admitting: Physician Assistant

## 2019-01-22 ENCOUNTER — Encounter: Payer: Self-pay | Admitting: Physician Assistant

## 2019-01-22 ENCOUNTER — Ambulatory Visit (INDEPENDENT_AMBULATORY_CARE_PROVIDER_SITE_OTHER): Payer: BC Managed Care – PPO | Admitting: Physician Assistant

## 2019-01-22 ENCOUNTER — Other Ambulatory Visit: Payer: Self-pay

## 2019-01-22 VITALS — BP 131/67 | HR 110 | Temp 98.4°F | Ht 68.0 in | Wt 199.0 lb

## 2019-01-22 DIAGNOSIS — F529 Unspecified sexual dysfunction not due to a substance or known physiological condition: Secondary | ICD-10-CM

## 2019-01-22 DIAGNOSIS — F411 Generalized anxiety disorder: Secondary | ICD-10-CM | POA: Diagnosis not present

## 2019-01-22 DIAGNOSIS — K602 Anal fissure, unspecified: Secondary | ICD-10-CM

## 2019-01-22 DIAGNOSIS — G43009 Migraine without aura, not intractable, without status migrainosus: Secondary | ICD-10-CM

## 2019-01-22 DIAGNOSIS — E6609 Other obesity due to excess calories: Secondary | ICD-10-CM | POA: Diagnosis not present

## 2019-01-22 DIAGNOSIS — IMO0001 Reserved for inherently not codable concepts without codable children: Secondary | ICD-10-CM

## 2019-01-22 DIAGNOSIS — Z6834 Body mass index (BMI) 34.0-34.9, adult: Secondary | ICD-10-CM

## 2019-01-22 MED ORDER — PHENTERMINE HCL 37.5 MG PO TABS: 37.5000 mg | ORAL_TABLET | Freq: Every day | ORAL | 0 refills | Status: DC

## 2019-01-22 MED ORDER — NITROGLYCERIN 2 % TD OINT: 1.0000 [in_us] | TOPICAL_OINTMENT | Freq: Three times a day (TID) | TRANSDERMAL | 0 refills | Status: DC

## 2019-01-22 MED ORDER — NITROGLYCERIN 0.4 % RE OINT: 1.0000 [in_us] | TOPICAL_OINTMENT | Freq: Two times a day (BID) | RECTAL | 1 refills | Status: DC | PRN

## 2019-01-22 NOTE — Progress Notes (Signed)
Subjective:    Patient ID: Devin Kim, male    DOB: 01/26/1996, 23 y.o.   MRN: [id number]  HPI  Pt is a 23 yo obese male who presents to the clinic for weight follow up. He also continues to struggle with anal fissure.   He is doing great with weight loss on phentermine. Down 27lbs in one month. Denies any worsening constipation, dry mouth, mood. He is not exercising but has really been watching his diet. He wears apple watch and not noticed his HR being elevated. Denies any CP or palpitations.   He continues to have a lot of rectal pain from fissure. He follows up with GI in next week. He is doing regular sitz baths and using the diltazem cream with little relief. Sitting makes worse. He tries to lay down and stand. He is thankfully able to work from home during the [place] pandemic. He has been following fiber diet from GI. BMs are usually soft. At times will also use miralax.   Migraines have improved. Used maxalt once this month.   He did start counseling. It has been a hard experience but he feels like he is dealing with some past issues.   .. Active Ambulatory Problems    Diagnosis Date Noted  . Hypothyroidism 08/09/2013  . Right upper quadrant pain 08/12/2013  . Nausea with vomiting 08/12/2013  . Seizures ([place]) 08/30/2013  . Anal fissure 10/21/2013  . Essential hypertension, benign 08/27/2014  . GAD (generalized anxiety disorder) 08/27/2014  . High risk sexual behavior 08/27/2014  . Tobacco dependence in remission 08/10/2015  . High risk bisexual behavior 08/26/2016  . Erectile dysfunction 08/26/2016  . Frequent headaches 08/26/2016  . Thrombosed hemorrhoids 03/24/2017  . Tachycardia determined by examination of pulse 06/01/2017  . Night sweats 11/23/2017  . Elevated liver enzymes 11/24/2017  . Hypertriglyceridemia 11/24/2017  . Elevated LDL cholesterol level 11/24/2017  . Indigestion 02/27/2018  . Migraine without aura and without status migrainosus, not intractable  02/27/2018  . Chronic neck pain 03/27/2018  . Atypical chest pain 05/21/2018  . Sexuality issues 12/21/2018  . Class 1 obesity due to excess calories without serious comorbidity with body mass index (BMI) of 34.0 to 34.9 in adult 12/21/2018   Resolved Ambulatory Problems    Diagnosis Date Noted  . Tobacco abuse 08/09/2013  . Tobacco abuse 11/22/2013  . Syphilis 11/25/2013   Past Medical History:  Diagnosis Date  . Hypertension   . Thyroid disease       Review of Systems  All other systems reviewed and are negative.      Objective:   Physical Exam Vitals signs reviewed.  Constitutional:      Appearance: Normal appearance.  Cardiovascular:     Rate and Rhythm: Regular rhythm. Tachycardia present.  Pulmonary:     Effort: Pulmonary effort is normal.     Breath sounds: Normal breath sounds.  Neurological:     General: No focal deficit present.     Mental Status: He is alert and oriented to person, place, and time.  Psychiatric:        Mood and Affect: Mood normal.           Assessment & Plan:  Marland KitchenMarland KitchenIssaac was seen today for weight check.  Diagnoses and all orders for this visit:  Class 1 obesity due to excess calories without serious comorbidity with body mass index (BMI) of 34.0 to 34.9 in adult -     phentermine (ADIPEX-P) 37.5 MG tablet;  Take 1 tablet (37.5 mg total) by mouth daily before breakfast.  Anal fissure -     Nitroglycerin 0.4 % OINT; Place 1 inch rectally every 12 (twelve) hours as needed (for pain, use up to 3 weeks.). -     nitroGLYCERIN (NITROGLYN) 2 % ointment; Apply 1 inch topically 3 (three) times daily.  Migraine without aura and without status migrainosus, not intractable  GAD (generalized anxiety disorder)  Sexuality issues    .Marland Kitchen. [name] 01/22/2019 12/17/2018 05/16/2018 02/27/2018 11/22/2017  Decreased Interest 0 0 1 0 0  Down, Depressed, Hopeless 0 0 1 0 0  PHQ - 2 Score 0 0 2 0 0  Altered sleeping 0 0 0 0 -  Tired,  decreased energy 0 0 0 0 -  Change in appetite 0 0 0 0 -  Feeling bad or failure about yourself  0 0 0 0 -  Trouble concentrating 0 0 0 0 -  Moving slowly or fidgety/restless 0 0 0 0 -  Suicidal thoughts 0 - 0 0 -  PHQ-9 Score 0 0 2 0 -  Difficult doing work/chores Not difficult at all Not difficult at all Somewhat difficult - -   .Marland Kitchen. GAD 7 : Generalized Anxiety Score 01/22/2019 12/17/2018 05/16/2018 02/27/2018  Nervous, Anxious, on Edge 0 0 2 1  Control/stop worrying 0 0 2 0  Worry too much - different things 0 0 2 0  Trouble relaxing 0 0 1 0  Restless 0 0 1 0  Easily annoyed or irritable 0 0 0 0  Afraid - awful might happen 0 0 1 1  Total GAD 7 Score 0 0 9 2  Anxiety Difficulty Not difficult at all Not difficult at all Somewhat difficult Somewhat difficult    Doing great on weight loss. Try to add in walking. Continue to eat healthy and drink a lot of water. HR elevated in office today. Did go way down on 2nd recheck. Go down to half tablet daily. Follow up in 2 months with nurse visit.   Added nitroglycerin ointment to see if could add some relief. Continue to follow up with GI. Watch for constipation side effects of phentermine. Constipation could make fissure worse.   Continue with counseling. Although hard now worth the work.   Nitro .4 was really expensive good rx was much better price.

## 2019-01-23 ENCOUNTER — Encounter: Payer: Self-pay | Admitting: Physician Assistant

## 2019-02-13 ENCOUNTER — Other Ambulatory Visit: Payer: Self-pay | Admitting: Physician Assistant

## 2019-02-13 DIAGNOSIS — K30 Functional dyspepsia: Secondary | ICD-10-CM

## 2019-02-26 DIAGNOSIS — K5900 Constipation, unspecified: Secondary | ICD-10-CM | POA: Diagnosis not present

## 2019-02-26 DIAGNOSIS — K602 Anal fissure, unspecified: Secondary | ICD-10-CM | POA: Diagnosis not present

## 2019-02-26 DIAGNOSIS — K6289 Other specified diseases of anus and rectum: Secondary | ICD-10-CM | POA: Diagnosis not present

## 2019-03-01 ENCOUNTER — Encounter: Payer: Self-pay | Admitting: Physician Assistant

## 2019-03-01 NOTE — Telephone Encounter (Signed)
Called Pt and spoke with him regarding his concerns. He has been seeing his therapist weekly (does virtual visits from a program his employer offers). He reports he is stable but would like to talk with Luvenia Starch regarding his anxiety concerns. States his therapist advised it would be great to bring PCP into his treatment. Pt is OK to wait until PCP is back next week. No immediate concerns. Denies suicidal thoughts. Pt has been scheduled.

## 2019-03-05 ENCOUNTER — Telehealth (INDEPENDENT_AMBULATORY_CARE_PROVIDER_SITE_OTHER): Payer: BC Managed Care – PPO | Admitting: Physician Assistant

## 2019-03-05 VITALS — BP 133/92 | Temp 98.0°F | Ht 68.0 in | Wt 193.0 lb

## 2019-03-05 DIAGNOSIS — F411 Generalized anxiety disorder: Secondary | ICD-10-CM

## 2019-03-05 DIAGNOSIS — F410 Panic disorder [episodic paroxysmal anxiety] without agoraphobia: Secondary | ICD-10-CM | POA: Diagnosis not present

## 2019-03-05 MED ORDER — LORAZEPAM 0.5 MG PO TABS: 0.5000 mg | ORAL_TABLET | Freq: Three times a day (TID) | ORAL | 0 refills | Status: DC | PRN

## 2019-03-05 NOTE — Progress Notes (Signed)
Patient ID: Devin Kim, male   DOB: 11/24/1995, 23 y.o.   MRN: [id number] .Marland Kitchen.Virtual Visit via Video Note  I connected with Devin Kim on 03/07/19 at 11:10 AM EDT by a video enabled telemedicine application and verified that I am speaking with the correct person using two identifiers.  Location: Patient: home Provider: clinic   I discussed the limitations of evaluation and management by telemedicine and the availability of in person appointments. The patient expressed understanding and agreed to proceed.  History of Present Illness: Pt is a 23 yo male who presents to the clinic to follow up on anxiety and to discuss new panic attacks. Pt is currently in counseling and bring up a lot of old events that have happened to him that he has not dealt with. He continues to work through his sexuality. In the last month he is having what feels like panic attacks. They last 5-15 minutes and accompanied by chest tightness, SOB, sweating, and anxiety. Occurring 2-3 times a week. He does mention past suicidal thoughts when he was 16 that sent him to a inpatient BH. He was over medicated and when he came out felt 'drugged'. He came off all medication and has been hesitant to start anything else. He denies any SI/HC.   Marland Kitchen.. Active Ambulatory Problems    Diagnosis Date Noted  . Hypothyroidism 08/09/2013  . Right upper quadrant pain 08/12/2013  . Nausea with vomiting 08/12/2013  . Seizures ([facility]) 08/30/2013  . Anal fissure 10/21/2013  . Essential hypertension, benign 08/27/2014  . GAD (generalized anxiety disorder) 08/27/2014  . High risk sexual behavior 08/27/2014  . Tobacco dependence in remission 08/10/2015  . High risk bisexual behavior 08/26/2016  . Erectile dysfunction 08/26/2016  . Frequent headaches 08/26/2016  . Thrombosed hemorrhoids 03/24/2017  . Tachycardia determined by examination of pulse 06/01/2017  . Night sweats 11/23/2017  . Elevated liver enzymes 11/24/2017  . Hypertriglyceridemia  11/24/2017  . Elevated LDL cholesterol level 11/24/2017  . Indigestion 02/27/2018  . Migraine without aura and without status migrainosus, not intractable 02/27/2018  . Chronic neck pain 03/27/2018  . Atypical chest pain 05/21/2018  . Sexuality issues 12/21/2018  . Class 1 obesity due to excess calories without serious comorbidity with body mass index (BMI) of 34.0 to 34.9 in adult 12/21/2018  . Panic attacks 03/07/2019   Resolved Ambulatory Problems    Diagnosis Date Noted  . Tobacco abuse 08/09/2013  . Tobacco abuse 11/22/2013  . Syphilis 11/25/2013   Past Medical History:  Diagnosis Date  . Hypertension   . Thyroid disease    Reviewed med, allergies, problem list.      Observations/Objective:  NO acute distress. Normal mood and appearance.   .. Depression screen [facility] 03/05/2019 01/22/2019 12/17/2018 05/16/2018 02/27/2018  Decreased Interest 0 0 0 1 0  Down, Depressed, Hopeless 1 0 0 1 0  PHQ - 2 Score 1 0 0 2 0  Altered sleeping 2 0 0 0 0  Tired, decreased energy 2 0 0 0 0  Change in appetite 0 0 0 0 0  Feeling bad or failure about yourself  1 0 0 0 0  Trouble concentrating 1 0 0 0 0  Moving slowly or fidgety/restless 0 0 0 0 0  Suicidal thoughts 0 0 - 0 0  PHQ-9 Score 7 0 0 2 0  Difficult doing work/chores Somewhat difficult Not difficult at all Not difficult at all Somewhat difficult -   .. GAD 7 : Generalized  Anxiety Score 03/05/2019 01/22/2019 12/17/2018 05/16/2018  Nervous, Anxious, on Edge 2 0 0 2  Control/stop worrying 2 0 0 2  Worry too much - different things 1 0 0 2  Trouble relaxing 0 0 0 1  Restless 0 0 0 1  Easily annoyed or irritable 1 0 0 0  Afraid - awful might happen 0 0 0 1  Total GAD 7 Score 6 0 0 9  Anxiety Difficulty Not difficult at all Not difficult at all Not difficult at all Somewhat difficult     Assessment and Plan: Marland KitchenMarland KitchenTruman was seen today for anxiety.  Diagnoses and all orders for this visit:  GAD (generalized anxiety  disorder)  Panic attacks -     LORazepam (ATIVAN) 0.5 MG tablet; Take 1 tablet (0.5 mg total) by mouth every 8 (eight) hours as needed for anxiety.   It does appear he is having panic attacks. Started ativan to use as needed. Discussed dependency risk and to use sparingly. He did not want to start SSRI at this time. Discussed how we could start low and slow. I will give him a few months of as needed ativan but if he is using regularly he agreed to consider SSRI in the future. Pt is going to counseling reguarly. Encouraged exercise and good sleep routine.    Follow Up Instructions:    I discussed the assessment and treatment plan with the patient. The patient was provided an opportunity to ask questions and all were answered. The patient agreed with the plan and demonstrated an understanding of the instructions.   The patient was advised to call back or seek an in-person evaluation if the symptoms worsen or if the condition fails to improve as anticipated.   Iran Planas, PA-C

## 2019-03-05 NOTE — Progress Notes (Signed)
Panic attacks, SOB Used to have as a kid and now back PHQ9-GAD7 completed.

## 2019-03-07 ENCOUNTER — Encounter: Payer: Self-pay | Admitting: Physician Assistant

## 2019-03-07 DIAGNOSIS — F410 Panic disorder [episodic paroxysmal anxiety] without agoraphobia: Secondary | ICD-10-CM | POA: Insufficient documentation

## 2019-03-15 ENCOUNTER — Telehealth: Payer: Self-pay | Admitting: Physician Assistant

## 2019-03-15 NOTE — Telephone Encounter (Signed)
CaseId:[id number];Status:Approved;Review Type:Prior Auth;Coverage Start Date:02/13/2019;Coverage End Date:03/14/2020. Pharmacy aware.

## 2019-03-19 DIAGNOSIS — K602 Anal fissure, unspecified: Secondary | ICD-10-CM | POA: Diagnosis not present

## 2019-03-25 ENCOUNTER — Ambulatory Visit: Payer: BC Managed Care – PPO

## 2019-03-26 DIAGNOSIS — Z01818 Encounter for other preprocedural examination: Secondary | ICD-10-CM | POA: Diagnosis not present

## 2019-03-29 DIAGNOSIS — F419 Anxiety disorder, unspecified: Secondary | ICD-10-CM | POA: Diagnosis not present

## 2019-03-29 DIAGNOSIS — Z87891 Personal history of nicotine dependence: Secondary | ICD-10-CM | POA: Diagnosis not present

## 2019-03-29 DIAGNOSIS — Z79899 Other long term (current) drug therapy: Secondary | ICD-10-CM | POA: Diagnosis not present

## 2019-03-29 DIAGNOSIS — K648 Other hemorrhoids: Secondary | ICD-10-CM | POA: Diagnosis not present

## 2019-03-29 DIAGNOSIS — F329 Major depressive disorder, single episode, unspecified: Secondary | ICD-10-CM | POA: Diagnosis not present

## 2019-03-29 DIAGNOSIS — K602 Anal fissure, unspecified: Secondary | ICD-10-CM | POA: Diagnosis not present

## 2019-03-29 DIAGNOSIS — I10 Essential (primary) hypertension: Secondary | ICD-10-CM | POA: Diagnosis not present

## 2019-03-29 DIAGNOSIS — E039 Hypothyroidism, unspecified: Secondary | ICD-10-CM | POA: Diagnosis not present

## 2019-04-01 ENCOUNTER — Ambulatory Visit: Payer: BC Managed Care – PPO

## 2019-04-11 ENCOUNTER — Ambulatory Visit (INDEPENDENT_AMBULATORY_CARE_PROVIDER_SITE_OTHER): Payer: BC Managed Care – PPO | Admitting: Family Medicine

## 2019-04-11 ENCOUNTER — Other Ambulatory Visit: Payer: Self-pay

## 2019-04-11 DIAGNOSIS — E6609 Other obesity due to excess calories: Secondary | ICD-10-CM | POA: Diagnosis not present

## 2019-04-11 DIAGNOSIS — Z6834 Body mass index (BMI) 34.0-34.9, adult: Secondary | ICD-10-CM

## 2019-04-11 MED ORDER — PHENTERMINE HCL 37.5 MG PO TABS: 37.5000 mg | ORAL_TABLET | Freq: Every day | ORAL | 0 refills | Status: DC

## 2019-04-11 NOTE — Progress Notes (Signed)
Abnormal weight gain/BMI 30-we will go ahead and refill phentermine for 1 more month but he needs to lose at least 2 pounds before his follow-up in 1 month.  If not the medication will need to be discontinued and alternative plans made.  Just encouraged him to be as active as he is able to be after surgery and really continue to focus in on food choices and calorie counting

## 2019-04-11 NOTE — Progress Notes (Signed)
   Subjective:    Patient ID: Devin Kim, male    DOB: 16-Oct-1995, 23 y.o.   MRN: [id number]  HPI Patient is here for blood pressure and weight check. Denies trouble sleeping, palpitations, or medication problems.    Review of Systems     Objective:   Physical Exam        Assessment & Plan:  Patient has not lost weight. A refill for phentermine has been pended. Patient states that he had surgery about 2 weeks ago and has not been able to do much, he feels this is why he has not lost weight (last appt was a virtual visit and pt reported he weighed 193 lbs).   Sending note to provider in office and also to PCP so that she can decide about refill/advise on follow up

## 2019-05-14 DIAGNOSIS — Z09 Encounter for follow-up examination after completed treatment for conditions other than malignant neoplasm: Secondary | ICD-10-CM | POA: Diagnosis not present

## 2019-06-24 DIAGNOSIS — K645 Perianal venous thrombosis: Secondary | ICD-10-CM | POA: Diagnosis not present

## 2019-07-01 ENCOUNTER — Other Ambulatory Visit: Payer: Self-pay | Admitting: Physician Assistant

## 2019-07-01 DIAGNOSIS — G43009 Migraine without aura, not intractable, without status migrainosus: Secondary | ICD-10-CM

## 2019-10-01 ENCOUNTER — Other Ambulatory Visit: Payer: Self-pay | Admitting: Physician Assistant

## 2019-10-01 DIAGNOSIS — I10 Essential (primary) hypertension: Secondary | ICD-10-CM

## 2019-10-14 ENCOUNTER — Other Ambulatory Visit: Payer: Self-pay | Admitting: Physician Assistant

## 2019-10-14 DIAGNOSIS — I10 Essential (primary) hypertension: Secondary | ICD-10-CM

## 2019-11-20 ENCOUNTER — Other Ambulatory Visit: Payer: Self-pay | Admitting: Physician Assistant

## 2019-11-20 DIAGNOSIS — I10 Essential (primary) hypertension: Secondary | ICD-10-CM

## 2020-01-20 ENCOUNTER — Other Ambulatory Visit: Payer: Self-pay | Admitting: Physician Assistant

## 2020-01-20 DIAGNOSIS — K30 Functional dyspepsia: Secondary | ICD-10-CM

## 2020-01-24 ENCOUNTER — Other Ambulatory Visit: Payer: Self-pay | Admitting: Physician Assistant

## 2020-01-24 DIAGNOSIS — E039 Hypothyroidism, unspecified: Secondary | ICD-10-CM

## 2020-02-02 ENCOUNTER — Other Ambulatory Visit: Payer: Self-pay | Admitting: Physician Assistant

## 2020-02-02 DIAGNOSIS — K30 Functional dyspepsia: Secondary | ICD-10-CM

## 2020-02-20 ENCOUNTER — Telehealth: Payer: Self-pay | Admitting: Physician Assistant

## 2020-02-20 NOTE — Telephone Encounter (Signed)
Received fax for PA on [employer] sent through cover my meds and received authorization.   CaseId: [id number] Valid; 01/20/20 - 01/29/21  I will contact patients pharmacy so they can fill medication. - CF

## 2020-03-13 ENCOUNTER — Ambulatory Visit (INDEPENDENT_AMBULATORY_CARE_PROVIDER_SITE_OTHER): Payer: BC Managed Care – PPO | Admitting: Physician Assistant

## 2020-03-13 VITALS — BP 131/78 | HR 92 | Ht 68.0 in | Wt 213.0 lb

## 2020-03-13 DIAGNOSIS — Z6832 Body mass index (BMI) 32.0-32.9, adult: Secondary | ICD-10-CM

## 2020-03-13 DIAGNOSIS — G43009 Migraine without aura, not intractable, without status migrainosus: Secondary | ICD-10-CM

## 2020-03-13 DIAGNOSIS — Z131 Encounter for screening for diabetes mellitus: Secondary | ICD-10-CM

## 2020-03-13 DIAGNOSIS — Z0000 Encounter for general adult medical examination without abnormal findings: Secondary | ICD-10-CM

## 2020-03-13 DIAGNOSIS — E6609 Other obesity due to excess calories: Secondary | ICD-10-CM

## 2020-03-13 DIAGNOSIS — L700 Acne vulgaris: Secondary | ICD-10-CM

## 2020-03-13 DIAGNOSIS — Z1159 Encounter for screening for other viral diseases: Secondary | ICD-10-CM

## 2020-03-13 DIAGNOSIS — E039 Hypothyroidism, unspecified: Secondary | ICD-10-CM

## 2020-03-13 DIAGNOSIS — K30 Functional dyspepsia: Secondary | ICD-10-CM

## 2020-03-13 DIAGNOSIS — Z113 Encounter for screening for infections with a predominantly sexual mode of transmission: Secondary | ICD-10-CM

## 2020-03-13 DIAGNOSIS — I10 Essential (primary) hypertension: Secondary | ICD-10-CM

## 2020-03-13 DIAGNOSIS — Z13220 Encounter for screening for lipoid disorders: Secondary | ICD-10-CM

## 2020-03-13 DIAGNOSIS — Z114 Encounter for screening for human immunodeficiency virus [HIV]: Secondary | ICD-10-CM

## 2020-03-13 MED ORDER — LEVOTHYROXINE SODIUM 75 MCG PO TABS: 75.0000 ug | ORAL_TABLET | Freq: Every day | ORAL | 3 refills | Status: DC

## 2020-03-13 MED ORDER — CLINDAMYCIN PHOS-BENZOYL PEROX 1-5 % EX GEL: Freq: Two times a day (BID) | CUTANEOUS | 1 refills | Status: DC

## 2020-03-13 MED ORDER — RIZATRIPTAN BENZOATE 10 MG PO TABS: 10.0000 mg | ORAL_TABLET | ORAL | 5 refills | Status: DC | PRN

## 2020-03-13 MED ORDER — OMEPRAZOLE 40 MG PO CPDR: 40.0000 mg | DELAYED_RELEASE_CAPSULE | Freq: Every day | ORAL | 3 refills | Status: DC

## 2020-03-13 MED ORDER — PHENTERMINE HCL 37.5 MG PO TABS: 37.5000 mg | ORAL_TABLET | Freq: Every day | ORAL | 0 refills | Status: DC

## 2020-03-13 MED ORDER — LISINOPRIL 20 MG PO TABS: 20.0000 mg | ORAL_TABLET | Freq: Every day | ORAL | 3 refills | Status: DC

## 2020-03-13 NOTE — Progress Notes (Signed)
Subjective:    Patient ID: Devin Kim, male    DOB: Sep 28, 1995, 24 y.o.   MRN: [id number]  HPI  Pt is a 24 yo male who presents to the clinic for CPE.   He does need medication refills as well. He is doing well. No CP, palpitations, headaches, or vision changes. He is taking his lisionpril daily. Denies any problems with GERD on omeprazole.   He is having some cystic acne in beard line. This has been going on for months. It is painful at times. Keeping beard short does help.   Thyroid medicine taking with no problems. He would like phentermine again to lose weight.   .. Active Ambulatory Problems    Diagnosis Date Noted  . Hypothyroidism 08/09/2013  . Right upper quadrant pain 08/12/2013  . Nausea with vomiting 08/12/2013  . Seizures (HCC) 08/30/2013  . Anal fissure 10/21/2013  . Essential hypertension, benign 08/27/2014  . GAD (generalized anxiety disorder) 08/27/2014  . High risk sexual behavior 08/27/2014  . Tobacco dependence in remission 08/10/2015  . High risk bisexual behavior 08/26/2016  . Erectile dysfunction 08/26/2016  . Frequent headaches 08/26/2016  . Thrombosed hemorrhoids 03/24/2017  . Tachycardia determined by examination of pulse 06/01/2017  . Night sweats 11/23/2017  . Elevated liver enzymes 11/24/2017  . Hypertriglyceridemia 11/24/2017  . Elevated LDL cholesterol level 11/24/2017  . Indigestion 02/27/2018  . Migraine without aura and without status migrainosus, not intractable 02/27/2018  . Chronic neck pain 03/27/2018  . Atypical chest pain 05/21/2018  . Sexuality issues 12/21/2018  . Class 1 obesity due to excess calories without serious comorbidity with body mass index (BMI) of 34.0 to 34.9 in adult 12/21/2018  . Panic attacks 03/07/2019   Resolved Ambulatory Problems    Diagnosis Date Noted  . Tobacco abuse 08/09/2013  . Tobacco abuse 11/22/2013  . Syphilis 11/25/2013   Past Medical History:  Diagnosis Date  . Hypertension   . Thyroid  disease        Review of Systems  All other systems reviewed and are negative.      Objective:   Physical Exam BP 131/78   Pulse 92   Ht 5\' 8"  (1.727 m)   Wt 213 lb (96.6 kg)   SpO2 98%   BMI 32.39 kg/m   General Appearance:    Alert, cooperative, no distress, appears stated age  Head:    Normocephalic, without obvious abnormality, atraumatic  Eyes:    PERRL, conjunctiva/corneas clear, EOM's intact, fundi    benign, both eyes       Ears:    Normal TM's and external ear canals, both ears  Nose:   Nares normal, septum midline, mucosa normal, no drainage    or sinus tenderness  Throat:   Lips, mucosa, and tongue normal; teeth and gums normal  Neck:   Supple, symmetrical, trachea midline, no adenopathy;       thyroid:  No enlargement/tenderness/nodules; no carotid   bruit or JVD  Back:     Symmetric, no curvature, ROM normal, no CVA tenderness  Lungs:     Clear to auscultation bilaterally, respirations unlabored  Chest wall:    No tenderness or deformity  Heart:    Regular rate and rhythm, S1 and S2 normal, no murmur, rub   or gallop  Abdomen:     Soft, non-tender, bowel sounds active all four quadrants,    no masses, no organomegaly        Extremities:   Extremities  normal, atraumatic, no cyanosis or edema  Pulses:   2+ and symmetric all extremities  Skin:   Skin color, texture, turgor normal, no rashes or lesions. Cystic inflammatory acne along beard line.   Lymph nodes:   Cervical, supraclavicular, and axillary nodes normal  Neurologic:   CNII-XII intact. Normal strength, sensation and reflexes      throughout         Assessment & Plan:  Marland KitchenMarland KitchenElo was seen today for annual exam.  Diagnoses and all orders for this visit:  Routine physical examination -     CBC with Differential/Platelet -     COMPLETE METABOLIC PANEL WITH GFR -     TSH  Screening for diabetes mellitus -     COMPLETE METABOLIC PANEL WITH GFR  Acquired hypothyroidism -     TSH -      levothyroxine (SYNTHROID) 75 MCG tablet; Take 1 tablet (75 mcg total) by mouth daily before breakfast.  Screening for lipid disorders  Essential hypertension, benign -     COMPLETE METABOLIC PANEL WITH GFR -     lisinopril (ZESTRIL) 20 MG tablet; Take 1 tablet (20 mg total) by mouth daily.  Migraine without aura and without status migrainosus, not intractable -     rizatriptan (MAXALT) 10 MG tablet; Take 1 tablet (10 mg total) by mouth as needed for migraine. May repeat in 2 hours if needed  Indigestion -     omeprazole (PRILOSEC) 40 MG capsule; Take 1 capsule (40 mg total) by mouth daily.  Encounter for hepatitis C screening test for low risk patient -     Hepatitis C Antibody  Encounter for screening for HIV -     HIV antibody (with reflex)  Screening for STD (sexually transmitted disease) -     HIV antibody (with reflex) -     RPR  Class 1 obesity due to excess calories without serious comorbidity with body mass index (BMI) of 33.0 to 33.9 in adult -     phentermine (ADIPEX-P) 37.5 MG tablet; Take 1 tablet (37.5 mg total) by mouth daily before breakfast.  Acne vulgaris -     clindamycin-benzoyl peroxide (BENZACLIN) gel; Apply topically 2 (two) times daily. -     doxycycline (VIBRAMYCIN) 50 MG capsule; Take 1 capsule (50 mg total) by mouth 2 (two) times daily. For 3 months.   Marland Kitchen.Start a regular exercise program and make sure you are eating a healthy diet Try to eat 4 servings of dairy a day or take a calcium supplement (500mg  twice a day). Your vaccines are up to date.  Fasting labs ordered today.  STD screening done.  Discussed PrEP. Discussed risk vs benefits. He is in a monogamous relationship and declines today.   Medications refills given.   .Discussed low carb diet with 1500 calories and 80g of protein.  Exercising at least 150 minutes a week.  [employer]itness Pal could be a Marland Kitchen.  Started phentermine at patiens requests. Follow up in 1 month.   Discussed  acne.  Start with cleansing face every night with soft cleanser like cetaphil.  Start doxycycline for 2 months.  Start topical benzaclin every night.  Follow up as needed.

## 2020-03-13 NOTE — Patient Instructions (Signed)

## 2020-03-16 ENCOUNTER — Encounter: Payer: Self-pay | Admitting: Physician Assistant

## 2020-03-16 DIAGNOSIS — D729 Disorder of white blood cells, unspecified: Secondary | ICD-10-CM

## 2020-03-16 LAB — COMPLETE METABOLIC PANEL WITH GFR
AG Ratio: 1.9 (calc) (ref 1.0–2.5)
ALT: 52 U/L — ABNORMAL HIGH (ref 9–46)
AST: 31 U/L (ref 10–40)
Albumin: 4.9 g/dL (ref 3.6–5.1)
Alkaline phosphatase (APISO): 61 U/L (ref 36–130)
BUN: 14 mg/dL (ref 7–25)
CO2: 25 mmol/L (ref 20–32)
Calcium: 10 mg/dL (ref 8.6–10.3)
Chloride: 101 mmol/L (ref 98–110)
Creat: 1.06 mg/dL (ref 0.60–1.35)
GFR, Est African American: 112 mL/min/{1.73_m2} (ref >=60)
GFR, Est Non African American: 96 mL/min/{1.73_m2} (ref >=60)
Globulin: 2.6 g/dL (calc) (ref 1.9–3.7)
Glucose, Bld: 79 mg/dL (ref 65–99)
Potassium: 4.4 mmol/L (ref 3.5–5.3)
Sodium: 138 mmol/L (ref 135–146)
Total Bilirubin: 0.5 mg/dL (ref 0.2–1.2)
Total Protein: 7.5 g/dL (ref 6.1–8.1)

## 2020-03-16 LAB — HIV ANTIBODY (ROUTINE TESTING W REFLEX): HIV 1&2 Ab, 4th Generation: NONREACTIVE

## 2020-03-16 LAB — CBC WITH DIFFERENTIAL/PLATELET
Absolute Monocytes: 1279 cells/uL — ABNORMAL HIGH (ref 200–950)
Basophils Absolute: 83 cells/uL (ref 0–200)
Basophils Relative: 0.6 %
Eosinophils Absolute: 181 cells/uL (ref 15–500)
Eosinophils Relative: 1.3 %
HCT: 45.4 % (ref 38.5–50.0)
Hemoglobin: 15.8 g/dL (ref 13.2–17.1)
Lymphs Abs: 2752 cells/uL (ref 850–3900)
MCH: 31.2 pg (ref 27.0–33.0)
MCHC: 34.8 g/dL (ref 32.0–36.0)
MCV: 89.7 fL (ref 80.0–100.0)
MPV: 11.5 fL (ref 7.5–12.5)
Monocytes Relative: 9.2 %
Neutro Abs: 9605 cells/uL — ABNORMAL HIGH (ref 1500–7800)
Neutrophils Relative %: 69.1 %
Platelets: 219 10*3/uL (ref 140–400)
RBC: 5.06 10*6/uL (ref 4.20–5.80)
RDW: 12.5 % (ref 11.0–15.0)
Total Lymphocyte: 19.8 %
WBC: 13.9 10*3/uL — ABNORMAL HIGH (ref 3.8–10.8)

## 2020-03-16 LAB — HEPATITIS C ANTIBODY
Hepatitis C Ab: NONREACTIVE
SIGNAL TO CUT-OFF: 0.03 (ref <1.00)

## 2020-03-16 LAB — RPR: RPR Ser Ql: NONREACTIVE

## 2020-03-16 LAB — TSH: TSH: 2.68 mIU/L (ref 0.40–4.50)

## 2020-03-16 MED ORDER — DOXYCYCLINE HYCLATE 50 MG PO CAPS: 50.0000 mg | ORAL_CAPSULE | Freq: Two times a day (BID) | ORAL | 2 refills | Status: DC

## 2020-03-16 NOTE — Progress Notes (Signed)
Devin Kim,   Your WBC are elevated which usually indicates an infection somewhere. Have you had any steroids recently. Does anything feel bad or not right?  HIV negative.  RPR and hep C pending for STD testing.  Thyroid normal.  One liver enyzme a little elevated but improved from past.

## 2020-03-16 NOTE — Telephone Encounter (Signed)
Recheck CBC in 1 week 

## 2020-03-17 ENCOUNTER — Encounter: Payer: Self-pay | Admitting: Physician Assistant

## 2020-03-17 DIAGNOSIS — L700 Acne vulgaris: Secondary | ICD-10-CM | POA: Insufficient documentation

## 2020-03-17 NOTE — Telephone Encounter (Signed)
CBC ordered 

## 2020-05-08 ENCOUNTER — Encounter: Payer: Self-pay | Admitting: Physician Assistant

## 2020-05-27 ENCOUNTER — Other Ambulatory Visit: Payer: Self-pay

## 2020-05-27 ENCOUNTER — Encounter: Payer: Self-pay | Admitting: Physician Assistant

## 2020-05-27 ENCOUNTER — Ambulatory Visit (INDEPENDENT_AMBULATORY_CARE_PROVIDER_SITE_OTHER): Payer: BC Managed Care – PPO | Admitting: Physician Assistant

## 2020-05-27 DIAGNOSIS — K602 Anal fissure, unspecified: Secondary | ICD-10-CM

## 2020-05-27 MED ORDER — IBUPROFEN 800 MG PO TABS: 800.0000 mg | ORAL_TABLET | Freq: Three times a day (TID) | ORAL | 0 refills | Status: DC | PRN

## 2020-05-27 MED ORDER — TRAMADOL HCL 50 MG PO TABS: 50.0000 mg | ORAL_TABLET | Freq: Four times a day (QID) | ORAL | 0 refills | Status: AC | PRN

## 2020-05-27 MED ORDER — NITROGLYCERIN 0.4 % RE OINT: 1.0000 [in_us] | TOPICAL_OINTMENT | Freq: Two times a day (BID) | RECTAL | 1 refills | Status: DC | PRN

## 2020-05-27 NOTE — Progress Notes (Signed)
Subjective:    Patient ID: Devin Kim, male    DOB: June 06, 1996, 24 y.o.   MRN: [id number]  HPI  Pt is a 24 yo bisexual male with hx of anal fissure and hemorrhoids who presents to the clinic with rectal pain. He noticed the pain returned after about 3 sexual encounters with his male partner and engaging in anal sex, starting back in October.  He had to have botox injection last year after rectal pain that persisted for many months. Denies any blood in stool. Pain is mostly with anal intercourse and sitting. He is doing sitz baths and using a OtC hemorrhoid cream which is not helping. He is also using OTC lidocaine which does provide some temporary relief. At times pain is 9/10.      .. Active Ambulatory Problems    Diagnosis Date Noted  . Hypothyroidism 08/09/2013  . Right upper quadrant pain 08/12/2013  . Nausea with vomiting 08/12/2013  . Seizures (HCC) 08/30/2013  . Anal fissure 10/21/2013  . Essential hypertension, benign 08/27/2014  . GAD (generalized anxiety disorder) 08/27/2014  . High risk sexual behavior 08/27/2014  . Tobacco dependence in remission 08/10/2015  . High risk bisexual behavior 08/26/2016  . Erectile dysfunction 08/26/2016  . Frequent headaches 08/26/2016  . Thrombosed hemorrhoids 03/24/2017  . Tachycardia determined by examination of pulse 06/01/2017  . Night sweats 11/23/2017  . Elevated liver enzymes 11/24/2017  . Hypertriglyceridemia 11/24/2017  . Elevated LDL cholesterol level 11/24/2017  . Indigestion 02/27/2018  . Migraine without aura and without status migrainosus, not intractable 02/27/2018  . Chronic neck pain 03/27/2018  . Atypical chest pain 05/21/2018  . Sexuality issues 12/21/2018  . Class 1 obesity due to excess calories without serious comorbidity with body mass index (BMI) of 32.0 to 32.9 in adult 12/21/2018  . Panic attacks 03/07/2019  . Acne vulgaris 03/17/2020   Resolved Ambulatory Problems    Diagnosis Date Noted  . Tobacco  abuse 08/09/2013  . Tobacco abuse 11/22/2013  . Syphilis 11/25/2013   Past Medical History:  Diagnosis Date  . Hypertension   . Thyroid disease       Review of Systems See HPI.     Objective:   Physical Exam Vitals reviewed.  Constitutional:      Appearance: Normal appearance.  Genitourinary:    Comments: No external hemorrhoids.  Pain with DRE.  No palpated internal hemorrhoids.  Anoscopy was attempted but very painful was able to see evidence of bleeding and appearance of fissure around 6 oclock and 3oclock.  Neurological:     General: No focal deficit present.     Mental Status: He is alert.  Psychiatric:        Mood and Affect: Mood normal.           Assessment & Plan:  Marland KitchenMarland KitchenJabreel was seen today for rectal pain.  Diagnoses and all orders for this visit:  Anal fissure -     Nitroglycerin 0.4 % OINT; Place 1 inch rectally every 12 (twelve) hours as needed (for pain, use up to 3 weeks.). -     traMADol (ULTRAM) 50 MG tablet; Take 1 tablet (50 mg total) by mouth every 6 (six) hours as needed for up to 5 days. -     ibuprofen (ADVIL) 800 MG tablet; Take 1 tablet (800 mg total) by mouth every 8 (eight) hours as needed.   Pt did not like the last referral where his botox injections were done. Will make new referral.  Consider muscle relaxer with intercourse in the future once this exacerbation has resolved.   Spent 40 minutes with education and prevention.

## 2020-05-27 NOTE — Patient Instructions (Signed)
Anal Fissure, Adult  An anal fissure is a small tear or crack in the tissue of the anus. Bleeding from a fissure usually stops on its own within a few minutes. However, bleeding will often occur again with each bowel movement until the fissure heals. What are the causes? This condition is usually caused by passing a large or hard stool (feces). Other causes include:  Constipation.  Frequent diarrhea.  Inflammatory bowel disease (Crohn's disease or ulcerative colitis).  Childbirth.  Infections.  Anal sex. What are the signs or symptoms? Symptoms of this condition include:  Bleeding from the rectum.  Small amounts of blood seen on your stool, on the toilet paper, or in the toilet after a bowel movement. The blood coats the outside of the stool and is not mixed with the stool.  Painful bowel movements.  Itching or irritation around the anus. How is this diagnosed? A health care provider may diagnose this condition by closely examining the anal area. An anal fissure can usually be seen with careful inspection. In some cases, a rectal exam may be performed, or a short tube (anoscope) may be used to examine the anal canal. How is this treated? Initial treatment for this condition may include:  Taking steps to avoid constipation. This may include making changes to your diet, such as increasing your intake of fiber or fluid.  Taking fiber supplements. These supplements can soften your stool to help make bowel movements easier. Your health care provider may also prescribe a stool softener if your stool is hard.  Taking sitz baths. This may help to heal the tear.  Using medicated creams or ointments. These may be prescribed to lessen discomfort. Treatments that are sometimes used if initial treatments do not work well or if the condition is more severe may include:  Botulinum injection.  Surgery to repair the fissure. Follow these instructions at home: Eating and  drinking   Avoid foods that may cause constipation, such as bananas, milk, and other dairy products.  Eat all fruits, except bananas.  Drink enough fluid to keep your urine pale yellow.  Eat foods that are high in fiber, such as beans, whole grains, and fresh fruits and vegetables. General instructions   Take over-the-counter and prescription medicines only as told by your health care provider.  Use creams or ointments only as told by your health care provider.  Keep the anal area clean and dry.  Take sitz baths as told by your health care provider. Do not use soap in the sitz baths.  Keep all follow-up visits as told by your health care provider. This is important. Contact a health care provider if you have:  More bleeding.  A fever.  Diarrhea that is mixed with blood.  Pain that continues.  Ongoing problems that are getting worse rather than better. Summary  An anal fissure is a small tear or crack in the tissue of the anus. This condition is usually caused by passing a large or hard stool (feces). Other causes include constipation and frequent diarrhea.  Initial treatment for this condition may include taking steps to avoid constipation, such as increasing your intake of fiber or fluid.  Follow instructions for care as told by your health care provider.  Contact your health care provider if you have more bleeding or your problem is getting worse rather than better.  Keep all follow-up visits as told by your health care provider. This is important. This information is not intended to replace advice given   to you by your health care provider. Make sure you discuss any questions you have with your health care provider. Document Revised: 11/23/2017 Document Reviewed: 11/23/2017 Elsevier Patient Education  2020 [employer]  

## 2020-06-03 NOTE — Addendum Note (Signed)
Addended byJomarie Longs on: 06/03/2020 01:10 PM   Modules accepted: Orders

## 2020-06-03 NOTE — Progress Notes (Signed)
Patient made aware and is okay with change in referral.

## 2020-06-09 ENCOUNTER — Encounter: Payer: Self-pay | Admitting: Gastroenterology

## 2020-06-26 ENCOUNTER — Other Ambulatory Visit: Payer: Self-pay | Admitting: Physician Assistant

## 2020-06-26 DIAGNOSIS — K602 Anal fissure, unspecified: Secondary | ICD-10-CM

## 2020-07-14 ENCOUNTER — Ambulatory Visit: Payer: BC Managed Care – PPO | Admitting: Gastroenterology

## 2020-07-17 ENCOUNTER — Encounter: Payer: Self-pay | Admitting: Physician Assistant

## 2020-07-26 ENCOUNTER — Other Ambulatory Visit: Payer: Self-pay | Admitting: Physician Assistant

## 2020-07-26 DIAGNOSIS — K602 Anal fissure, unspecified: Secondary | ICD-10-CM

## 2020-08-28 ENCOUNTER — Other Ambulatory Visit: Payer: Self-pay | Admitting: Physician Assistant

## 2020-08-28 DIAGNOSIS — K602 Anal fissure, unspecified: Secondary | ICD-10-CM

## 2020-09-04 ENCOUNTER — Encounter: Payer: Self-pay | Admitting: Physician Assistant

## 2020-09-04 ENCOUNTER — Telehealth: Payer: Self-pay | Admitting: Neurology

## 2020-09-04 NOTE — Telephone Encounter (Signed)
Agree with filling out paperwork.

## 2020-09-04 NOTE — Telephone Encounter (Signed)
Patient left vm asking if we can fill out accomodation forms for his job since he is going back to in person soon.   I called patient back and he states that they do not have assigned desks and get moved around constantly, he needs a letter from a provider if he needs an assigned desk. He states with his issues with anal fissures he doesn't want to have to move his cushions, etc. Everyday. He also has a history of seizures, even though he hasn't had one in a long time he wants to stay in one place to be easier to identify if that did happen. I told him to send forms and we would complete.   Doran Nestle - FYI.

## 2020-09-04 NOTE — Telephone Encounter (Signed)
Can we print to get filled out today?

## 2021-02-26 ENCOUNTER — Telehealth: Payer: Self-pay | Admitting: Neurology

## 2021-02-26 NOTE — Telephone Encounter (Signed)
Prior Authorization for [employer] submitted via covermymeds.  Information regarding your request Drug is covered by current benefit plan. No further PA activity needed

## 2021-05-13 ENCOUNTER — Other Ambulatory Visit: Payer: Self-pay | Admitting: Physician Assistant

## 2021-05-13 DIAGNOSIS — I10 Essential (primary) hypertension: Secondary | ICD-10-CM

## 2021-05-18 ENCOUNTER — Other Ambulatory Visit: Payer: Self-pay | Admitting: Physician Assistant

## 2021-05-18 DIAGNOSIS — E039 Hypothyroidism, unspecified: Secondary | ICD-10-CM

## 2021-08-13 ENCOUNTER — Encounter: Payer: Self-pay | Admitting: Physician Assistant

## 2021-08-13 ENCOUNTER — Other Ambulatory Visit: Payer: Self-pay

## 2021-08-13 ENCOUNTER — Ambulatory Visit (INDEPENDENT_AMBULATORY_CARE_PROVIDER_SITE_OTHER): Payer: BC Managed Care – PPO | Admitting: Physician Assistant

## 2021-08-13 VITALS — BP 125/70 | HR 109 | Ht 68.0 in | Wt 170.0 lb

## 2021-08-13 DIAGNOSIS — E039 Hypothyroidism, unspecified: Secondary | ICD-10-CM | POA: Diagnosis not present

## 2021-08-13 DIAGNOSIS — Z131 Encounter for screening for diabetes mellitus: Secondary | ICD-10-CM | POA: Diagnosis not present

## 2021-08-13 DIAGNOSIS — Z13220 Encounter for screening for lipoid disorders: Secondary | ICD-10-CM | POA: Diagnosis not present

## 2021-08-13 DIAGNOSIS — K30 Functional dyspepsia: Secondary | ICD-10-CM

## 2021-08-13 DIAGNOSIS — Z113 Encounter for screening for infections with a predominantly sexual mode of transmission: Secondary | ICD-10-CM

## 2021-08-13 DIAGNOSIS — K5904 Chronic idiopathic constipation: Secondary | ICD-10-CM

## 2021-08-13 DIAGNOSIS — K602 Anal fissure, unspecified: Secondary | ICD-10-CM

## 2021-08-13 DIAGNOSIS — Z0000 Encounter for general adult medical examination without abnormal findings: Secondary | ICD-10-CM | POA: Diagnosis not present

## 2021-08-13 DIAGNOSIS — L700 Acne vulgaris: Secondary | ICD-10-CM

## 2021-08-13 DIAGNOSIS — I10 Essential (primary) hypertension: Secondary | ICD-10-CM

## 2021-08-13 DIAGNOSIS — G43009 Migraine without aura, not intractable, without status migrainosus: Secondary | ICD-10-CM

## 2021-08-13 MED ORDER — LISINOPRIL 20 MG PO TABS: 20.0000 mg | ORAL_TABLET | Freq: Every day | ORAL | 1 refills | Status: DC

## 2021-08-13 MED ORDER — RIZATRIPTAN BENZOATE 10 MG PO TABS: 10.0000 mg | ORAL_TABLET | ORAL | 5 refills | Status: DC | PRN

## 2021-08-13 MED ORDER — LINACLOTIDE 145 MCG PO CAPS: 145.0000 ug | ORAL_CAPSULE | Freq: Every day | ORAL | 1 refills | Status: DC

## 2021-08-13 MED ORDER — DOXYCYCLINE HYCLATE 50 MG PO CAPS: 50.0000 mg | ORAL_CAPSULE | Freq: Two times a day (BID) | ORAL | 2 refills | Status: DC

## 2021-08-13 MED ORDER — NITROGLYCERIN 0.4 % RE OINT: 1.0000 [in_us] | TOPICAL_OINTMENT | Freq: Two times a day (BID) | RECTAL | 1 refills | Status: DC | PRN

## 2021-08-13 NOTE — Patient Instructions (Signed)

## 2021-08-13 NOTE — Progress Notes (Signed)
Subjective:    Patient ID: Devin Kim, male    DOB: 05/22/1996, 26 y.o.   MRN: [id number]  HPI Pt is a 26 yo male who presents to the clinic for routine physical.   Pt continues to have problems with anal fissures and constipation. They are better than they have been. He is not having anal sex with new partner. He tried botox and helped but does not want to do it again. Nitroglyercin does help.  .. Active Ambulatory Problems    Diagnosis Date Noted   Hypothyroidism 08/09/2013   Right upper quadrant pain 08/12/2013   Nausea with vomiting 08/12/2013   Seizures (HCC) 08/30/2013   Anal fissure 10/21/2013   Essential hypertension, benign 08/27/2014   GAD (generalized anxiety disorder) 08/27/2014   High risk sexual behavior 08/27/2014   Tobacco dependence in remission 08/10/2015   High risk bisexual behavior 08/26/2016   Erectile dysfunction 08/26/2016   Frequent headaches 08/26/2016   Thrombosed hemorrhoids 03/24/2017   Tachycardia determined by examination of pulse 06/01/2017   Night sweats 11/23/2017   Elevated liver enzymes 11/24/2017   Hypertriglyceridemia 11/24/2017   Elevated LDL cholesterol level 11/24/2017   Indigestion 02/27/2018   Migraine without aura and without status migrainosus, not intractable 02/27/2018   Chronic neck pain 03/27/2018   Atypical chest pain 05/21/2018   Sexuality issues 12/21/2018   Class 1 obesity due to excess calories without serious comorbidity with body mass index (BMI) of 32.0 to 32.9 in adult 12/21/2018   Panic attacks 03/07/2019   Acne vulgaris 03/17/2020   Chronic idiopathic constipation 08/17/2021   Resolved Ambulatory Problems    Diagnosis Date Noted   Tobacco abuse 08/09/2013   Tobacco abuse 11/22/2013   Syphilis 11/25/2013   Past Medical History:  Diagnosis Date   Hypertension    Thyroid disease    .Marland Kitchen Social History   Socioeconomic History   Marital status: Single    Spouse name: Not on file   Number of children: Not  on file   Years of education: Not on file   Highest education level: Not on file  Occupational History   Not on file  Tobacco Use   Smoking status: Former    Types: Cigarettes    Start date: 08/26/2013   Smokeless tobacco: Never  Substance and Sexual Activity   Alcohol use: No    Alcohol/week: 0.0 standard drinks   Drug use: No   Sexual activity: Not Currently  Other Topics Concern   Not on file  Social History Narrative   Not on file   Social Determinants of Health   Financial Resource Strain: Not on file  Food Insecurity: Not on file  Transportation Needs: Not on file  Physical Activity: Not on file  Stress: Not on file  Social Connections: Not on file  Intimate Partner Violence: Not on file   .Marland Kitchen Family History  Problem Relation Age of Onset   Alcohol abuse Father    Hypertension Father    Diabetes Paternal Uncle    Cancer Paternal Grandmother    Hypertension Paternal Grandfather       Review of Systems  All other systems reviewed and are negative.     Objective:   Physical Exam BP 125/70    Pulse (!) 109    Ht 5\' 8"  (1.727 m)    Wt 170 lb (77.1 kg)    SpO2 99%    BMI 25.85 kg/m   General Appearance:    Alert, cooperative, no  distress, appears stated age  Head:    Normocephalic, without obvious abnormality, atraumatic  Eyes:    PERRL, conjunctiva/corneas clear, EOM's intact, fundi    benign, both eyes       Ears:    Normal TM's and external ear canals, both ears  Nose:   Nares normal, septum midline, mucosa normal, no drainage    or sinus tenderness  Throat:   Lips, mucosa, and tongue normal; teeth and gums normal  Neck:   Supple, symmetrical, trachea midline, no adenopathy;       thyroid:  No enlargement/tenderness/nodules; no carotid   bruit or JVD  Back:     Symmetric, no curvature, ROM normal, no CVA tenderness  Lungs:     Clear to auscultation bilaterally, respirations unlabored  Chest wall:    No tenderness or deformity  Heart:    Regular rate and  rhythm, S1 and S2 normal, no murmur, rub   or gallop  Abdomen:     Soft, non-tender, bowel sounds active all four quadrants,    no masses, no organomegaly        Extremities:   Extremities normal, atraumatic, no cyanosis or edema  Pulses:   2+ and symmetric all extremities  Skin:   Skin color, texture, turgor normal, no rashes or lesions  Lymph nodes:   Cervical, supraclavicular, and axillary nodes normal  Neurologic:   CNII-XII intact. Normal strength, sensation and reflexes      throughout    .Marland Kitchen Depression screen [facility] 2/9 03/13/2020 03/05/2019 01/22/2019 12/17/2018 05/16/2018  Decreased Interest 0 0 0 0 1  Down, Depressed, Hopeless 0 1 0 0 1  PHQ - 2 Score 0 1 0 0 2  Altered sleeping 0 2 0 0 0  Tired, decreased energy 0 2 0 0 0  Change in appetite 1 0 0 0 0  Feeling bad or failure about yourself  0 1 0 0 0  Trouble concentrating 1 1 0 0 0  Moving slowly or fidgety/restless 0 0 0 0 0  Suicidal thoughts 0 0 0 - 0  PHQ-9 Score 2 7 0 0 2  Difficult doing work/chores Somewhat difficult Somewhat difficult Not difficult at all Not difficult at all Somewhat difficult   .Marland Kitchen GAD 7 : Generalized Anxiety Score 03/13/2020 03/05/2019 01/22/2019 12/17/2018  Nervous, Anxious, on Edge 1 2 0 0  Control/stop worrying 0 2 0 0  Worry too much - different things 0 1 0 0  Trouble relaxing 1 0 0 0  Restless 1 0 0 0  Easily annoyed or irritable 0 1 0 0  Afraid - awful might happen 1 0 0 0  Total GAD 7 Score 4 6 0 0  Anxiety Difficulty Somewhat difficult Not difficult at all Not difficult at all Not difficult at all         Assessment & Plan:  Marland KitchenMarland KitchenBraxen was seen today for annual exam.  Diagnoses and all orders for this visit:  Routine physical examination -     TSH -     COMPLETE METABOLIC PANEL WITH GFR -     CBC with Differential/Platelet  Screening for diabetes mellitus -     COMPLETE METABOLIC PANEL WITH GFR  Acquired hypothyroidism -     TSH  Screening for lipid disorders  Essential  hypertension, benign -     COMPLETE METABOLIC PANEL WITH GFR -     lisinopril (ZESTRIL) 20 MG tablet; Take 1 tablet (20 mg total) by mouth daily.  Migraine without aura and without status migrainosus, not intractable -     rizatriptan (MAXALT) 10 MG tablet; Take 1 tablet (10 mg total) by mouth as needed for migraine. May repeat in 2 hours if needed  Indigestion  Acne vulgaris -     doxycycline (VIBRAMYCIN) 50 MG capsule; Take 1 capsule (50 mg total) by mouth 2 (two) times daily. For 3 months.  Anal fissure -     Nitroglycerin 0.4 % OINT; Place 1 inch rectally every 12 (twelve) hours as needed (for pain, use up to 3 weeks.). -     linaclotide (LINZESS) 145 MCG CAPS capsule; Take 1 capsule (145 mcg total) by mouth daily before breakfast.  Screening examination for STD (sexually transmitted disease) -     HIV antibody (with reflex) -     RPR -     C. trachomatis/N. gonorrhoeae RNA  Chronic idiopathic constipation -     linaclotide (LINZESS) 145 MCG CAPS capsule; Take 1 capsule (145 mcg total) by mouth daily before breakfast.   ..Start a regular exercise program and make sure you are eating a healthy diet Try to eat 4 servings of dairy a day or take a calcium supplement (500mg  twice a day). Fasting labs ordered.  STD order due to new sexual partner.  PHQ/GAD numbers look pretty good. Refilled medications Start linzess for constipation and to help with fissures.

## 2021-08-16 ENCOUNTER — Other Ambulatory Visit: Payer: Self-pay | Admitting: Neurology

## 2021-08-16 DIAGNOSIS — R748 Abnormal levels of other serum enzymes: Secondary | ICD-10-CM

## 2021-08-16 LAB — CBC WITH DIFFERENTIAL/PLATELET
Absolute Monocytes: 918 cells/uL (ref 200–950)
Basophils Absolute: 56 cells/uL (ref 0–200)
Basophils Relative: 0.5 %
Eosinophils Absolute: 202 cells/uL (ref 15–500)
Eosinophils Relative: 1.8 %
HCT: 43.9 % (ref 38.5–50.0)
Hemoglobin: 15 g/dL (ref 13.2–17.1)
Lymphs Abs: 2374 cells/uL (ref 850–3900)
MCH: 30.9 pg (ref 27.0–33.0)
MCHC: 34.2 g/dL (ref 32.0–36.0)
MCV: 90.5 fL (ref 80.0–100.0)
MPV: 12.3 fL (ref 7.5–12.5)
Monocytes Relative: 8.2 %
Neutro Abs: 7650 cells/uL (ref 1500–7800)
Neutrophils Relative %: 68.3 %
Platelets: 270 10*3/uL (ref 140–400)
RBC: 4.85 10*6/uL (ref 4.20–5.80)
RDW: 11.7 % (ref 11.0–15.0)
Total Lymphocyte: 21.2 %
WBC: 11.2 10*3/uL — ABNORMAL HIGH (ref 3.8–10.8)

## 2021-08-16 LAB — COMPLETE METABOLIC PANEL WITH GFR
AG Ratio: 2.1 (calc) (ref 1.0–2.5)
ALT: 18 U/L (ref 9–46)
AST: 16 U/L (ref 10–40)
Albumin: 4.9 g/dL (ref 3.6–5.1)
Alkaline phosphatase (APISO): 47 U/L (ref 36–130)
BUN/Creatinine Ratio: 13 (calc) (ref 6–22)
BUN: 17 mg/dL (ref 7–25)
CO2: 29 mmol/L (ref 20–32)
Calcium: 10.2 mg/dL (ref 8.6–10.3)
Chloride: 101 mmol/L (ref 98–110)
Creat: 1.3 mg/dL — ABNORMAL HIGH (ref 0.60–1.24)
Globulin: 2.3 g/dL (calc) (ref 1.9–3.7)
Glucose, Bld: 82 mg/dL (ref 65–99)
Potassium: 4.6 mmol/L (ref 3.5–5.3)
Sodium: 139 mmol/L (ref 135–146)
Total Bilirubin: 0.5 mg/dL (ref 0.2–1.2)
Total Protein: 7.2 g/dL (ref 6.1–8.1)
eGFR: 77 mL/min/{1.73_m2} (ref >=60)

## 2021-08-16 LAB — HIV ANTIBODY (ROUTINE TESTING W REFLEX): HIV 1&2 Ab, 4th Generation: NONREACTIVE

## 2021-08-16 LAB — TSH: TSH: 1.06 mIU/L (ref 0.40–4.50)

## 2021-08-16 LAB — RPR: RPR Ser Ql: NONREACTIVE

## 2021-08-16 LAB — C. TRACHOMATIS/N. GONORRHOEAE RNA
C. trachomatis RNA, TMA: NOT DETECTED
N. gonorrhoeae RNA, TMA: NOT DETECTED

## 2021-08-16 NOTE — Progress Notes (Signed)
Wren,   Creatinine a little high. Recheck in 1 month. Liver enzymes look good.  WBC a little elevated today.  Negative for STD. Thyroid looks great!

## 2021-08-17 ENCOUNTER — Encounter: Payer: Self-pay | Admitting: Physician Assistant

## 2021-08-17 DIAGNOSIS — K5904 Chronic idiopathic constipation: Secondary | ICD-10-CM | POA: Insufficient documentation

## 2021-10-16 ENCOUNTER — Other Ambulatory Visit: Payer: Self-pay | Admitting: Physician Assistant

## 2021-10-16 DIAGNOSIS — E039 Hypothyroidism, unspecified: Secondary | ICD-10-CM

## 2022-08-29 ENCOUNTER — Other Ambulatory Visit: Payer: Self-pay | Admitting: Physician Assistant

## 2022-08-29 DIAGNOSIS — K602 Anal fissure, unspecified: Secondary | ICD-10-CM

## 2022-08-29 DIAGNOSIS — K5904 Chronic idiopathic constipation: Secondary | ICD-10-CM

## 2022-09-14 ENCOUNTER — Other Ambulatory Visit: Payer: Self-pay | Admitting: Physician Assistant

## 2022-09-14 DIAGNOSIS — I10 Essential (primary) hypertension: Secondary | ICD-10-CM

## 2022-09-19 ENCOUNTER — Other Ambulatory Visit: Payer: Self-pay | Admitting: Physician Assistant

## 2022-09-19 DIAGNOSIS — I10 Essential (primary) hypertension: Secondary | ICD-10-CM

## 2022-09-20 ENCOUNTER — Other Ambulatory Visit: Payer: Self-pay | Admitting: Physician Assistant

## 2022-09-20 DIAGNOSIS — E039 Hypothyroidism, unspecified: Secondary | ICD-10-CM

## 2022-09-29 ENCOUNTER — Telehealth: Payer: Self-pay | Admitting: Physician Assistant

## 2022-09-29 DIAGNOSIS — I10 Essential (primary) hypertension: Secondary | ICD-10-CM

## 2022-09-29 NOTE — Telephone Encounter (Signed)
Call patient and notify him that a prescription was sent to his pharmacy on March 21 for 90 tabs to [facility] [place].  He needs to call there and have them get the medication ready for him.

## 2022-09-29 NOTE — Telephone Encounter (Signed)
Pt called requesting a refill of lisinopril (ZESTRIL) 20 MG tablet [id number] to be sent to [id number] - [place], [place] - [address] AT [address].

## 2022-09-30 ENCOUNTER — Other Ambulatory Visit: Payer: Self-pay

## 2022-09-30 DIAGNOSIS — I10 Essential (primary) hypertension: Secondary | ICD-10-CM

## 2022-09-30 MED ORDER — LISINOPRIL 20 MG PO TABS: 20.0000 mg | ORAL_TABLET | Freq: Every day | ORAL | 0 refills | Status: DC

## 2022-09-30 NOTE — Telephone Encounter (Signed)
Patient informed and will check with pharmacy.

## 2022-10-03 ENCOUNTER — Encounter: Payer: Self-pay | Admitting: Physician Assistant

## 2022-10-03 ENCOUNTER — Ambulatory Visit (INDEPENDENT_AMBULATORY_CARE_PROVIDER_SITE_OTHER): Payer: BC Managed Care – PPO | Admitting: Physician Assistant

## 2022-10-03 VITALS — BP 118/68 | HR 89 | Ht 67.0 in | Wt 174.0 lb

## 2022-10-03 DIAGNOSIS — I10 Essential (primary) hypertension: Secondary | ICD-10-CM

## 2022-10-03 DIAGNOSIS — E039 Hypothyroidism, unspecified: Secondary | ICD-10-CM | POA: Diagnosis not present

## 2022-10-03 DIAGNOSIS — F411 Generalized anxiety disorder: Secondary | ICD-10-CM

## 2022-10-03 DIAGNOSIS — F458 Other somatoform disorders: Secondary | ICD-10-CM

## 2022-10-03 DIAGNOSIS — Z79899 Other long term (current) drug therapy: Secondary | ICD-10-CM

## 2022-10-03 MED ORDER — FLUOXETINE HCL 10 MG PO TABS: 10.0000 mg | ORAL_TABLET | Freq: Every day | ORAL | 1 refills | Status: DC

## 2022-10-03 MED ORDER — BACLOFEN 10 MG PO TABS: 10.0000 mg | ORAL_TABLET | Freq: Every day | ORAL | 0 refills | Status: DC

## 2022-10-03 MED ORDER — LISINOPRIL 20 MG PO TABS: 20.0000 mg | ORAL_TABLET | Freq: Every day | ORAL | 1 refills | Status: DC

## 2022-10-03 NOTE — Patient Instructions (Addendum)
Start prozac Baclofen at bedtime

## 2022-10-03 NOTE — Progress Notes (Signed)
Established Patient Office Visit  Subjective   Patient ID: Devin Kim, male    DOB: Nov 11, 1995  Age: 27 y.o. MRN: [id number]  No chief complaint on file.   HPI Pt is a 27 yo male who presents to the clinic for medication refills and discuss teeth grinding and anxiety. He has always had anxiety but recently worsened. He has a night mouth guard but still grinding teeth and jaw sore in the morning. Would like to try medication.   Needs labs for thyroid medication.   .. Active Ambulatory Problems    Diagnosis Date Noted   Hypothyroidism 08/09/2013   Right upper quadrant pain 08/12/2013   Nausea with vomiting 08/12/2013   Seizures 08/30/2013   Anal fissure 10/21/2013   Essential hypertension, benign 08/27/2014   GAD (generalized anxiety disorder) 08/27/2014   High risk sexual behavior 08/27/2014   Tobacco dependence in remission 08/10/2015   High risk bisexual behavior 08/26/2016   Erectile dysfunction 08/26/2016   Frequent headaches 08/26/2016   Thrombosed hemorrhoids 03/24/2017   Tachycardia determined by examination of pulse 06/01/2017   Night sweats 11/23/2017   Elevated liver enzymes 11/24/2017   Hypertriglyceridemia 11/24/2017   Elevated LDL cholesterol level 11/24/2017   Indigestion 02/27/2018   Migraine without aura and without status migrainosus, not intractable 02/27/2018   Chronic neck pain 03/27/2018   Atypical chest pain 05/21/2018   Sexuality issues 12/21/2018   Class 1 obesity due to excess calories without serious comorbidity with body mass index (BMI) of 32.0 to 32.9 in adult 12/21/2018   Panic attacks 03/07/2019   Acne vulgaris 03/17/2020   Chronic idiopathic constipation 08/17/2021   Teeth grinding 10/03/2022   Resolved Ambulatory Problems    Diagnosis Date Noted   Tobacco abuse 08/09/2013   Tobacco abuse 11/22/2013   Syphilis 11/25/2013   Past Medical History:  Diagnosis Date   Hypertension    Thyroid disease      ROS See HPI.     Objective:     BP 118/68   Pulse 89   Ht  (1.702 m)   Wt 174 lb (78.9 kg)   SpO2 99%   BMI 27.25 kg/m  BP Readings from Last 3 Encounters:  10/03/22 118/68  08/13/21 125/70  05/27/20 (!) 156/94   Wt Readings from Last 3 Encounters:  10/03/22 174 lb (78.9 kg)  08/13/21 170 lb (77.1 kg)  05/27/20 203 lb (92.1 kg)  ..    10/10/2022   12:42 PM 03/13/2020    3:58 PM 03/05/2019   11:01 AM 01/22/2019    8:56 AM 12/17/2018    9:01 AM  Depression screen PHQ 2/9  Decreased Interest 0 0 0 0 0  Down, Depressed, Hopeless 1 0 1 0 0  PHQ - 2 Score 1 0 1 0 0  Altered sleeping 0 0 2 0 0  Tired, decreased energy 1 0 2 0 0  Change in appetite 1 1 0 0 0  Feeling bad or failure about yourself  1 0 1 0 0  Trouble concentrating 0 0  Moving slowly or fidgety/restless 1 0 0 0 0  Suicidal thoughts 1 0 0 0   PHQ-9 Score 0 0  Difficult doing work/chores Somewhat difficult Somewhat difficult Somewhat difficult Not difficult at all Not difficult at all   .Marland Kitchen    10/10/2022   12:42 PM 03/13/2020    3:58 PM 03/05/2019   11:03 AM 01/22/2019    8:58  AM  GAD 7 : Generalized Anxiety Score  Nervous, Anxious, on Edge 2 1 2  0  Control/stop worrying 2 0 2 0  Worry too much - different things 1 0 1 0  Trouble relaxing 1 1 0 0  Restless 1 1 0 0  Easily annoyed or irritable 1 0 1 0  Afraid - awful might happen 1 1 0 0  Total GAD 7 Score 9 4 6  0  Anxiety Difficulty Somewhat difficult Somewhat difficult Not difficult at all Not difficult at all        Physical Exam Constitutional:      Appearance: Normal appearance.  HENT:     Head: Normocephalic.  Neck:     Vascular: No carotid bruit.  Cardiovascular:     Rate and Rhythm: Normal rate and regular rhythm.     Pulses: Normal pulses.  Pulmonary:     Effort: Pulmonary effort is normal.     Breath sounds: Normal breath sounds.  Musculoskeletal:     Cervical back: Normal range of motion and neck supple. No rigidity or tenderness.      Right lower leg: No edema.     Left lower leg: No edema.  Lymphadenopathy:     Cervical: No cervical adenopathy.  Neurological:     General: No focal deficit present.     Mental Status: He is alert and oriented to person, place, and time.  Psychiatric:        Mood and Affect: Mood normal.        Assessment & Plan:  Marland KitchenMarland KitchenDiagnoses and all orders for this visit:  GAD (generalized anxiety disorder) -     COMPLETE METABOLIC PANEL WITH GFR -     FLUoxetine (PROZAC) 10 MG tablet; Take 1 tablet (10 mg total) by mouth daily.  Essential hypertension, benign -     TSH -     COMPLETE METABOLIC PANEL WITH GFR -     CBC w/Diff/Platelet -     lisinopril (ZESTRIL) 20 MG tablet; Take 1 tablet (20 mg total) by mouth daily.  Acquired hypothyroidism -     TSH  Teeth grinding -     baclofen (LIORESAL) 10 MG tablet; Take 1 tablet (10 mg total) by mouth at bedtime.  Medication management -     TSH -     COMPLETE METABOLIC PANEL WITH GFR -     CBC w/Diff/Platelet   Vitals look great Refilled lisinopril  Cmp, TSH, CBC ordered Will adjust thyroid medication as determined by labs Trial of baclofen at bedtime  GAD no to goal Start prozac Discussed side effects Follow up in 4-6 weeks     Tandy Gaw, PA-C

## 2022-10-04 LAB — CBC WITH DIFFERENTIAL/PLATELET
Absolute Monocytes: 762 cells/uL (ref 200–950)
Basophils Absolute: 40 cells/uL (ref 0–200)
Basophils Relative: 0.4 %
Eosinophils Absolute: 99 cells/uL (ref 15–500)
Eosinophils Relative: 1 %
HCT: 41.8 % (ref 38.5–50.0)
Hemoglobin: 14.4 g/dL (ref 13.2–17.1)
Lymphs Abs: 1960 cells/uL (ref 850–3900)
MCH: 31 pg (ref 27.0–33.0)
MCHC: 34.4 g/dL (ref 32.0–36.0)
MCV: 89.9 fL (ref 80.0–100.0)
MPV: 11 fL (ref 7.5–12.5)
Monocytes Relative: 7.7 %
Neutro Abs: 7039 cells/uL (ref 1500–7800)
Neutrophils Relative %: 71.1 %
Platelets: 222 10*3/uL (ref 140–400)
RBC: 4.65 10*6/uL (ref 4.20–5.80)
RDW: 12.2 % (ref 11.0–15.0)
Total Lymphocyte: 19.8 %
WBC: 9.9 10*3/uL (ref 3.8–10.8)

## 2022-10-04 LAB — COMPLETE METABOLIC PANEL WITH GFR
AG Ratio: 2.2 (calc) (ref 1.0–2.5)
ALT: 22 U/L (ref 9–46)
AST: 19 U/L (ref 10–40)
Albumin: 4.8 g/dL (ref 3.6–5.1)
Alkaline phosphatase (APISO): 49 U/L (ref 36–130)
BUN: 15 mg/dL (ref 7–25)
CO2: 31 mmol/L (ref 20–32)
Calcium: 9.8 mg/dL (ref 8.6–10.3)
Chloride: 105 mmol/L (ref 98–110)
Creat: 0.91 mg/dL (ref 0.60–1.24)
Globulin: 2.2 g/dL (calc) (ref 1.9–3.7)
Glucose, Bld: 91 mg/dL (ref 65–99)
Potassium: 4.3 mmol/L (ref 3.5–5.3)
Sodium: 144 mmol/L (ref 135–146)
Total Bilirubin: 0.3 mg/dL (ref 0.2–1.2)
Total Protein: 7 g/dL (ref 6.1–8.1)
eGFR: 117 mL/min/{1.73_m2} (ref >=60)

## 2022-10-04 LAB — TSH: TSH: 3 mIU/L (ref 0.40–4.50)

## 2022-10-04 NOTE — Progress Notes (Signed)
Banks,   WBC normal range.  Hemoglobin looks good.  Kidney, liver, glucose looks great.  TSH is normal range but upper limits and not to 1-2 where you have been in past. If you are having any HYPO(low) thyroid symptoms we could increase your supplementation. If not we could leave dose the same. If we change dose recheck la only in 6 weeks.

## 2022-10-05 ENCOUNTER — Other Ambulatory Visit: Payer: Self-pay

## 2022-10-05 DIAGNOSIS — E039 Hypothyroidism, unspecified: Secondary | ICD-10-CM

## 2022-10-10 ENCOUNTER — Encounter: Payer: Self-pay | Admitting: Physician Assistant

## 2022-10-28 ENCOUNTER — Telehealth: Payer: Self-pay | Admitting: Physician Assistant

## 2022-10-28 ENCOUNTER — Telehealth: Payer: Self-pay

## 2022-10-28 NOTE — Telephone Encounter (Signed)
Patient called.  In regards to last lab result note- Pt states he has been watching his symptoms and now feels he is having some hypothyroid symptoms  Fatigue (feeling exhausted) , cold sensation esp feet and hands. He is requesting to go ahead and increase the thyroid medication  and he will keep the virutal visit for 11/14/2022  and return for blood work work once the new medication is started.

## 2022-10-28 NOTE — Telephone Encounter (Signed)
Duplicate note

## 2022-10-31 MED ORDER — LEVOTHYROXINE SODIUM 88 MCG PO TABS: 88.0000 ug | ORAL_TABLET | Freq: Every day | ORAL | 1 refills | Status: DC

## 2022-10-31 NOTE — Telephone Encounter (Signed)
Patient informed  will start and will recheck lab work again in 4 to 6 weeks.

## 2022-11-03 ENCOUNTER — Other Ambulatory Visit: Payer: Self-pay | Admitting: Physician Assistant

## 2022-11-03 DIAGNOSIS — F411 Generalized anxiety disorder: Secondary | ICD-10-CM

## 2022-11-14 ENCOUNTER — Telehealth (INDEPENDENT_AMBULATORY_CARE_PROVIDER_SITE_OTHER): Payer: BC Managed Care – PPO | Admitting: Physician Assistant

## 2022-11-14 ENCOUNTER — Encounter: Payer: Self-pay | Admitting: Physician Assistant

## 2022-11-14 DIAGNOSIS — F458 Other somatoform disorders: Secondary | ICD-10-CM | POA: Diagnosis not present

## 2022-11-14 DIAGNOSIS — F411 Generalized anxiety disorder: Secondary | ICD-10-CM | POA: Diagnosis not present

## 2022-11-14 DIAGNOSIS — E039 Hypothyroidism, unspecified: Secondary | ICD-10-CM | POA: Diagnosis not present

## 2022-11-14 MED ORDER — FLUOXETINE HCL 20 MG PO TABS
20.0000 mg | ORAL_TABLET | Freq: Every day | ORAL | 0 refills | Status: DC
Start: 2022-11-14 — End: 2023-02-17

## 2022-11-14 NOTE — Progress Notes (Signed)
An attmpt to reach was made at 11:19

## 2022-11-14 NOTE — Progress Notes (Signed)
..Virtual Visit via Video Note  I connected with Devin Kim on 11/14/22 at  1:00 PM EDT by a video enabled telemedicine application and verified that I am speaking with the correct person using two identifiers.  Location: Patient: outside at work Provider: clinic  .Marland KitchenParticipating in visit:  Patient: Devin Kim Provider: Tandy Gaw PA-C   I discussed the limitations of evaluation and management by telemedicine and the availability of in person appointments. The patient expressed understanding and agreed to proceed.  History of Present Illness: Pt is a 27 yo male who calls into the clinic to follow up on Hypothyroidism and anxiety.   He does feel like symptoms are much better. Teeth grinding improved and more relaxed. Synthroid increased dose only for 2 weeks. Continues to have some anxiety and seems like it could be worsening again. No side effects to medication. No SI/HC.   Marland Kitchen. Active Ambulatory Problems    Diagnosis Date Noted   Hypothyroidism 08/09/2013   Right upper quadrant pain 08/12/2013   Nausea with vomiting 08/12/2013   Seizures (HCC) 08/30/2013   Anal fissure 10/21/2013   Essential hypertension, benign 08/27/2014   GAD (generalized anxiety disorder) 08/27/2014   High risk sexual behavior 08/27/2014   Tobacco dependence in remission 08/10/2015   High risk bisexual behavior 08/26/2016   Erectile dysfunction 08/26/2016   Frequent headaches 08/26/2016   Thrombosed hemorrhoids 03/24/2017   Tachycardia determined by examination of pulse 06/01/2017   Night sweats 11/23/2017   Elevated liver enzymes 11/24/2017   Hypertriglyceridemia 11/24/2017   Elevated LDL cholesterol level 11/24/2017   Indigestion 02/27/2018   Migraine without aura and without status migrainosus, not intractable 02/27/2018   Chronic neck pain 03/27/2018   Atypical chest pain 05/21/2018   Sexuality issues 12/21/2018   Class 1 obesity due to excess calories without serious comorbidity with body mass  index (BMI) of 32.0 to 32.9 in adult 12/21/2018   Panic attacks 03/07/2019   Acne vulgaris 03/17/2020   Chronic idiopathic constipation 08/17/2021   Teeth grinding 10/03/2022   Resolved Ambulatory Problems    Diagnosis Date Noted   Tobacco abuse 08/09/2013   Tobacco abuse 11/22/2013   Syphilis 11/25/2013   Past Medical History:  Diagnosis Date   Hypertension    Thyroid disease        Observations/Objective: No acute distress Normal mood and appearance Normal breathing   .Marland Kitchen    11/14/2022    1:07 PM 10/10/2022   12:42 PM 03/13/2020    3:58 PM 03/05/2019   11:03 AM  GAD 7 : Generalized Anxiety Score  Nervous, Anxious, on Edge 1 2 1 2   Control/stop worrying 1 2 0 2  Worry too much - different things 1 1 0 1  Trouble relaxing 0 1 1 0  Restless 0 1 1 0  Easily annoyed or irritable 1 1 0 1  Afraid - awful might happen 0 1 1 0  Total GAD 7 Score 4 9 4 6   Anxiety Difficulty Somewhat difficult Somewhat difficult Somewhat difficult Not difficult at all    .Marland Kitchen    11/14/2022    1:07 PM 10/10/2022   12:42 PM 03/13/2020    3:58 PM 03/05/2019   11:01 AM 01/22/2019    8:56 AM  Depression screen PHQ 2/9  Decreased Interest 0 0 0 0 0  Down, Depressed, Hopeless 1 1 0 1 0  PHQ - 2 Score 1 1 0 1 0  Altered sleeping 0 0 0 2 0  Tired, decreased  energy 0 1 0 2 0  Change in appetite 0 1 1 0 0  Feeling bad or failure about yourself  0 1 0 1 0  Trouble concentrating 0 1 1 1  0  Moving slowly or fidgety/restless 0 1 0 0 0  Suicidal thoughts 0 1 0 0 0  PHQ-9 Score 1 7 2 7  0  Difficult doing work/chores Somewhat difficult Somewhat difficult Somewhat difficult Somewhat difficult Not difficult at all      Assessment and Plan: Marland KitchenMarland KitchenLloyde was seen today for follow-up.  Diagnoses and all orders for this visit:  Acquired hypothyroidism -     TSH + free T4  Teeth grinding  GAD (generalized anxiety disorder) -     FLUoxetine (PROZAC) 20 MG tablet; Take 1 tablet (20 mg total) by mouth  daily.   Stay on increased dose of synthroid and recheck TSH/T4 in 4-6 weeks Improvement on anxiety  Increased prozac to 20mg  daily Follow up with get TSH drawn to see if doing better   Follow Up Instructions:    I discussed the assessment and treatment plan with the patient. The patient was provided an opportunity to ask questions and all were answered. The patient agreed with the plan and demonstrated an understanding of the instructions.   The patient was advised to call back or seek an in-person evaluation if the symptoms worsen or if the condition fails to improve as anticipated.   Tandy Gaw, PA-C

## 2022-11-23 ENCOUNTER — Other Ambulatory Visit: Payer: Self-pay | Admitting: Physician Assistant

## 2022-12-30 ENCOUNTER — Other Ambulatory Visit: Payer: Self-pay | Admitting: Physician Assistant

## 2022-12-30 DIAGNOSIS — F458 Other somatoform disorders: Secondary | ICD-10-CM

## 2023-02-16 ENCOUNTER — Other Ambulatory Visit: Payer: Self-pay | Admitting: Physician Assistant

## 2023-02-16 DIAGNOSIS — F411 Generalized anxiety disorder: Secondary | ICD-10-CM

## 2023-02-22 ENCOUNTER — Other Ambulatory Visit: Payer: Self-pay | Admitting: Physician Assistant

## 2023-03-31 ENCOUNTER — Other Ambulatory Visit: Payer: Self-pay | Admitting: Physician Assistant

## 2023-03-31 DIAGNOSIS — F458 Other somatoform disorders: Secondary | ICD-10-CM

## 2023-05-20 ENCOUNTER — Other Ambulatory Visit: Payer: Self-pay | Admitting: Physician Assistant

## 2023-05-20 DIAGNOSIS — F411 Generalized anxiety disorder: Secondary | ICD-10-CM

## 2023-06-23 ENCOUNTER — Other Ambulatory Visit: Payer: Self-pay | Admitting: Physician Assistant

## 2023-06-23 DIAGNOSIS — I10 Essential (primary) hypertension: Secondary | ICD-10-CM

## 2023-07-08 ENCOUNTER — Other Ambulatory Visit: Payer: Self-pay | Admitting: Physician Assistant

## 2023-07-08 DIAGNOSIS — F458 Other somatoform disorders: Secondary | ICD-10-CM

## 2023-08-07 ENCOUNTER — Ambulatory Visit (INDEPENDENT_AMBULATORY_CARE_PROVIDER_SITE_OTHER): Payer: BC Managed Care – PPO | Admitting: Physician Assistant

## 2023-08-07 ENCOUNTER — Encounter: Payer: Self-pay | Admitting: Physician Assistant

## 2023-08-07 VITALS — BP 147/77 | Ht 68.0 in | Wt 186.6 lb

## 2023-08-07 DIAGNOSIS — M778 Other enthesopathies, not elsewhere classified: Secondary | ICD-10-CM

## 2023-08-07 DIAGNOSIS — M25532 Pain in left wrist: Secondary | ICD-10-CM

## 2023-08-07 MED ORDER — DICLOFENAC SODIUM 1 % EX GEL: 4.0000 g | Freq: Four times a day (QID) | CUTANEOUS | 1 refills | Status: DC

## 2023-08-07 MED ORDER — PREDNISONE 50 MG PO TABS: ORAL_TABLET | ORAL | 0 refills | Status: DC

## 2023-08-07 MED ORDER — TRAMADOL HCL 50 MG PO TABS: 50.0000 mg | ORAL_TABLET | Freq: Three times a day (TID) | ORAL | 0 refills | Status: AC | PRN

## 2023-08-07 NOTE — Progress Notes (Signed)
Acute Office Visit  Subjective:     Patient ID: Devin Kim, male    DOB: [birth year], 28 y.o.   MRN: [id number]  Chief Complaint  Patient presents with   Wrist Pain    Wrist pain and decreased flexibility x 3 days - started after day of strenuous typing. Patient states has gotten worse daily.     HPI Patient is in today for left ulnar side wrist pain for 3 days. He denies any trauma. He has been typing a lot of work recently. He has never had anything like this before. Pain today is really bad and he cannot move left wrist at all. Pain starts in left unlar side wrist and radiates up arm. He has taken ibuprofen which helps some but not a lot.   .. Active Ambulatory Problems    Diagnosis Date Noted   Hypothyroidism 08/09/2013   Right upper quadrant pain 08/12/2013   Nausea with vomiting 08/12/2013   Seizures (HCC) 08/30/2013   Anal fissure 10/21/2013   Essential hypertension, benign 08/27/2014   GAD (generalized anxiety disorder) 08/27/2014   High risk sexual behavior 08/27/2014   Tobacco dependence in remission 08/10/2015   High risk bisexual behavior 08/26/2016   Erectile dysfunction 08/26/2016   Frequent headaches 08/26/2016   Thrombosed hemorrhoids 03/24/2017   Tachycardia determined by examination of pulse 06/01/2017   Night sweats 11/23/2017   Elevated liver enzymes 11/24/2017   Hypertriglyceridemia 11/24/2017   Elevated LDL cholesterol level 11/24/2017   Indigestion 02/27/2018   Migraine without aura and without status migrainosus, not intractable 02/27/2018   Chronic neck pain 03/27/2018   Atypical chest pain 05/21/2018   Sexuality issues 12/21/2018   Class 1 obesity due to excess calories without serious comorbidity with body mass index (BMI) of 32.0 to 32.9 in adult 12/21/2018   Panic attacks 03/07/2019   Acne vulgaris 03/17/2020   Chronic idiopathic constipation 08/17/2021   Teeth grinding 10/03/2022   Flexor carpi ulnaris tendinitis 08/08/2023   Resolved  Ambulatory Problems    Diagnosis Date Noted   Tobacco abuse 08/09/2013   Tobacco abuse 11/22/2013   Syphilis 11/25/2013   Past Medical History:  Diagnosis Date   Hypertension    Thyroid disease      ROS See HPI.      Objective:    BP (!) 147/77   Ht 5\' 8"  (1.727 m)   Wt 186 lb 9 oz (84.6 kg)   BMI 28.37 kg/m  BP Readings from Last 3 Encounters:  08/07/23 (!) 147/77  10/03/22 118/68  08/13/21 125/70   Wt Readings from Last 3 Encounters:  08/07/23 186 lb 9 oz (84.6 kg)  10/03/22 174 lb (78.9 kg)  08/13/21 170 lb (77.1 kg)      Physical Exam Musculoskeletal:     Comments: Left wrist:  Pinpoint tenderness over ulnar side of wrist and up into arm for about 2 inches over FCU. No flexion or extension of wrist without severe pain. No redness or swelling noted.  Slightly warm to touch on ulnar side  Negative phalens and tinels.   Left elbow not tender with normal ROM.   Neurological:     Mental Status: He is alert.          Assessment & Plan:  Marland KitchenMarland KitchenAydden was seen today for wrist pain.  Diagnoses and all orders for this visit:  Flexor carpi ulnaris tendinitis -     traMADol (ULTRAM) 50 MG tablet; Take 1 tablet (50 mg total) by mouth  every 8 (eight) hours as needed for up to 5 days. -     diclofenac Sodium (VOLTAREN) 1 % GEL; Apply 4 g topically 4 (four) times daily. To affected joint. -     predniSONE (DELTASONE) 50 MG tablet; Take one tablet for 5 days.  Left wrist pain   No trauma no need for xray right now Discussed brace to start wearing for wrist for the next 2 weeks Start prednisone for 5 days then start exercises for rehab Ice for 15 minutes at least twice a day Try to rest-note for work for light duty and work from home this week.  Can use voltaren gel right over painful area.  Tramadol for breakthrough pain as needed.    Tandy Gaw, PA-C

## 2023-08-07 NOTE — Patient Instructions (Signed)
 Prednisone  burst for 5 days Voltaren  gel as needed Tramadol  for break through pain

## 2023-08-08 ENCOUNTER — Encounter: Payer: Self-pay | Admitting: Physician Assistant

## 2023-08-08 DIAGNOSIS — M778 Other enthesopathies, not elsewhere classified: Secondary | ICD-10-CM | POA: Insufficient documentation

## 2023-08-09 ENCOUNTER — Encounter: Payer: Self-pay | Admitting: Physician Assistant

## 2023-08-26 ENCOUNTER — Other Ambulatory Visit: Payer: Self-pay | Admitting: Physician Assistant

## 2023-08-26 DIAGNOSIS — F411 Generalized anxiety disorder: Secondary | ICD-10-CM

## 2023-11-24 ENCOUNTER — Other Ambulatory Visit: Payer: Self-pay | Admitting: Physician Assistant

## 2023-11-25 ENCOUNTER — Other Ambulatory Visit: Payer: Self-pay | Admitting: Physician Assistant

## 2023-11-25 DIAGNOSIS — F411 Generalized anxiety disorder: Secondary | ICD-10-CM

## 2023-12-12 ENCOUNTER — Ambulatory Visit (INDEPENDENT_AMBULATORY_CARE_PROVIDER_SITE_OTHER): Admitting: Physician Assistant

## 2023-12-12 VITALS — BP 127/74 | HR 74 | Ht 68.0 in | Wt 185.0 lb

## 2023-12-12 DIAGNOSIS — Z13220 Encounter for screening for lipoid disorders: Secondary | ICD-10-CM

## 2023-12-12 DIAGNOSIS — I10 Essential (primary) hypertension: Secondary | ICD-10-CM

## 2023-12-12 DIAGNOSIS — K219 Gastro-esophageal reflux disease without esophagitis: Secondary | ICD-10-CM | POA: Diagnosis not present

## 2023-12-12 DIAGNOSIS — Z0000 Encounter for general adult medical examination without abnormal findings: Secondary | ICD-10-CM

## 2023-12-12 DIAGNOSIS — R632 Polyphagia: Secondary | ICD-10-CM | POA: Diagnosis not present

## 2023-12-12 DIAGNOSIS — F458 Other somatoform disorders: Secondary | ICD-10-CM

## 2023-12-12 DIAGNOSIS — F411 Generalized anxiety disorder: Secondary | ICD-10-CM

## 2023-12-12 DIAGNOSIS — E663 Overweight: Secondary | ICD-10-CM

## 2023-12-12 DIAGNOSIS — Z131 Encounter for screening for diabetes mellitus: Secondary | ICD-10-CM

## 2023-12-12 MED ORDER — FLUOXETINE HCL 20 MG PO TABS: 20.0000 mg | ORAL_TABLET | Freq: Every day | ORAL | 1 refills | Status: DC

## 2023-12-12 MED ORDER — LISDEXAMFETAMINE DIMESYLATE 10 MG PO CAPS
10.0000 mg | ORAL_CAPSULE | Freq: Every day | ORAL | 0 refills | Status: DC
Start: 2023-12-12 — End: 2024-02-19

## 2023-12-12 MED ORDER — LISINOPRIL 20 MG PO TABS: 20.0000 mg | ORAL_TABLET | Freq: Every day | ORAL | 1 refills | Status: AC

## 2023-12-12 MED ORDER — BACLOFEN 10 MG PO TABS: 10.0000 mg | ORAL_TABLET | Freq: Every day | ORAL | 3 refills | Status: AC

## 2023-12-12 NOTE — Patient Instructions (Signed)
 Health Maintenance, Male  Adopting a healthy lifestyle and getting preventive care are important in promoting health and wellness. Ask your health care provider about:  The right schedule for you to have regular tests and exams.  Things you can do on your own to prevent diseases and keep yourself healthy.  What should I know about diet, weight, and exercise?  Eat a healthy diet    Eat a diet that includes plenty of vegetables, fruits, low-fat dairy products, and lean protein.  Do not eat a lot of foods that are high in solid fats, added sugars, or sodium.  Maintain a healthy weight  Body mass index (BMI) is a measurement that can be used to identify possible weight problems. It estimates body fat based on height and weight. Your health care provider can help determine your BMI and help you achieve or maintain a healthy weight.  Get regular exercise  Get regular exercise. This is one of the most important things you can do for your health. Most adults should:  Exercise for at least 150 minutes each week. The exercise should increase your heart rate and make you sweat (moderate-intensity exercise).  Do strengthening exercises at least twice a week. This is in addition to the moderate-intensity exercise.  Spend less time sitting. Even light physical activity can be beneficial.  Watch cholesterol and blood lipids  Have your blood tested for lipids and cholesterol at 30 years of age, then have this test every 5 years.  You may need to have your cholesterol levels checked more often if:  Your lipid or cholesterol levels are high.  You are older than 30 years of age.  You are at high risk for heart disease.  What should I know about cancer screening?  Many types of cancers can be detected early and may often be prevented. Depending on your health history and family history, you may need to have cancer screening at various ages. This may include screening for:  Colorectal cancer.  Prostate cancer.  Skin cancer.  Lung  cancer.  What should I know about heart disease, diabetes, and high blood pressure?  Blood pressure and heart disease  High blood pressure causes heart disease and increases the risk of stroke. This is more likely to develop in people who have high blood pressure readings or are overweight.  Talk with your health care provider about your target blood pressure readings.  Have your blood pressure checked:  Every 3-5 years if you are 9-95 years of age.  Every year if you are 85 years old or older.  If you are between the ages of 29 and 29 and are a current or former smoker, ask your health care provider if you should have a one-time screening for abdominal aortic aneurysm (AAA).  Diabetes  Have regular diabetes screenings. This checks your fasting blood sugar level. Have the screening done:  Once every three years after age 23 if you are at a normal weight and have a low risk for diabetes.  More often and at a younger age if you are overweight or have a high risk for diabetes.  What should I know about preventing infection?  Hepatitis B  If you have a higher risk for hepatitis B, you should be screened for this virus. Talk with your health care provider to find out if you are at risk for hepatitis B infection.  Hepatitis C  Blood testing is recommended for:  Everyone born from [birth year] through 1967.  Anyone  with known risk factors for hepatitis C.  Sexually transmitted infections (STIs)  You should be screened each year for STIs, including gonorrhea and chlamydia, if:  You are sexually active and are younger than 30 years of age.  You are older than 30 years of age and your health care provider tells you that you are at risk for this type of infection.  Your sexual activity has changed since you were last screened, and you are at increased risk for chlamydia or gonorrhea. Ask your health care provider if you are at risk.  Ask your health care provider about whether you are at high risk for HIV. Your health care provider  may recommend a prescription medicine to help prevent HIV infection. If you choose to take medicine to prevent HIV, you should first get tested for HIV. You should then be tested every 3 months for as long as you are taking the medicine.  Follow these instructions at home:  Alcohol use  Do not drink alcohol if your health care provider tells you not to drink.  If you drink alcohol:  Limit how much you have to 0-2 drinks a day.  Know how much alcohol is in your drink. In the [place] one drink equals one 12 oz bottle of beer (355 mL), one 5 oz glass of wine (148 mL), or one 1 oz glass of hard liquor (44 mL).  Lifestyle  Do not use any products that contain nicotine or tobacco. These products include cigarettes, chewing tobacco, and vaping devices, such as e-cigarettes. If you need help quitting, ask your health care provider.  Do not use street drugs.  Do not share needles.  Ask your health care provider for help if you need support or information about quitting drugs.  General instructions  Schedule regular health, dental, and eye exams.  Stay current with your vaccines.  Tell your health care provider if:  You often feel depressed.  You have ever been abused or do not feel safe at home.  Summary  Adopting a healthy lifestyle and getting preventive care are important in promoting health and wellness.  Follow your health care provider's instructions about healthy diet, exercising, and getting tested or screened for diseases.  Follow your health care provider's instructions on monitoring your cholesterol and blood pressure.  This information is not intended to replace advice given to you by your health care provider. Make sure you discuss any questions you have with your health care provider.  Document Revised: 11/02/2020 Document Reviewed: 11/02/2020  Elsevier Patient Education  2024 [employer].

## 2023-12-12 NOTE — Progress Notes (Signed)
 Established Patient Office Visit  Subjective   Patient ID: Devin Kim, male    DOB: 1995-11-14  Age: 28 y.o. MRN: [id number]  Chief Complaint  Patient presents with   Annual Exam    Non fasting    HPI Devin Kim is here today for  his annual exam. He states over the last year, he has been doing well overall. He feels like his routine has been thrown off recently because his family came to stay with him for over a month. He says they just kept pushing back when they were going to leave and it threw off my whole schedule. He states that this routine shift threw off his compliance with Prozac  and not taking it paired with his family made his anxiety much worse. His family recently went back home. He started taking Prozac  consistently again last week. He feels like he is finally getting back into a routine now that he has peace at home. He switches between walking in the park for 30 minutes a day and gardening for exercise. He denies suicidal ideation.   He takes his lisinopril  70% of the time. He does not take his blood pressure often at home but when he does it runs about 140/90. He denies cardiac chest pain or pressure.   The baclofen  worked well for his teeth grinding, saying I stopped getting headaches, and my shoulders and jaw felt much more relaxed. He ran out about a month ago and his headaches have started again, he can feel himself clenching his jaw. He also notes that his son heard him grinding his teeth from across the house . He would like a refill of the baclofen . Denies tooth sensitivity.  His largest concern today is his weight gain. He says that he has been binging on food to the point where I feel sick 4-5 times a week for the past few months. He also notes that he throws up after these episodes about 1-2 times a week because he doesn't like the full feeling he gets after the episodes. He denies compensatory physical activity or restricting food to make up for the  binge  He notes he is getting some burning in his chest after he eats a big meal. It happens about 3 times a week. His mother gave him a box of Prilosec, he tried one and said that it helped.  Review of Systems  Gastrointestinal:  Positive for heartburn and vomiting. Negative for constipation, diarrhea and nausea.  Psychiatric/Behavioral:  Negative for suicidal ideas. The patient is not nervous/anxious.       Objective:     BP 127/74   Pulse 74   Ht 5' 8 (1.727 m)   Wt 185 lb (83.9 kg)   SpO2 98%   BMI 28.13 kg/m  BP Readings from Last 3 Encounters:  12/12/23 127/74  08/07/23 (!) 147/77  10/03/22 118/68   Wt Readings from Last 3 Encounters:  12/12/23 185 lb (83.9 kg)  08/07/23 186 lb 9 oz (84.6 kg)  10/03/22 174 lb (78.9 kg)    .Devin Kim    12/12/2023    4:21 PM 08/07/2023    1:18 PM 11/14/2022    1:07 PM 10/10/2022   12:42 PM 03/13/2020    3:58 PM  Depression screen PHQ 2/9  Decreased Interest 2 0 0 0 0  Down, Depressed, Hopeless 1 0 1 1 0  PHQ - 2 Score 3 0 1 1 0  Altered sleeping 0  0 0 0  Tired, decreased energy 1  0 1 0  Change in appetite 3  0 1 1  Feeling bad or failure about yourself  0  0 1 0  Trouble concentrating 1  0 1 1  Moving slowly or fidgety/restless 0  0 1 0  Suicidal thoughts 0  0 1 0  PHQ-9 Score 8  1 7 2   Difficult doing work/chores Not difficult at all  Somewhat difficult Somewhat difficult Somewhat difficult      Physical Exam Constitutional:      Appearance: Normal appearance.  HENT:     Head: Normocephalic.   Cardiovascular:     Rate and Rhythm: Normal rate and regular rhythm.     Pulses: Normal pulses.     Heart sounds: Normal heart sounds.  Pulmonary:     Effort: Pulmonary effort is normal.  Abdominal:     General: Abdomen is flat.     Palpations: Abdomen is soft.     Tenderness: There is no abdominal tenderness.   Musculoskeletal:     Cervical back: Normal range of motion and neck supple.     Right lower leg: No edema.      Left lower leg: No edema.   Neurological:     General: No focal deficit present.     Mental Status: He is alert and oriented to person, place, and time.   Psychiatric:        Mood and Affect: Mood normal.        Assessment & Plan:  Devin AasAaron AasCobi was seen today for annual exam.  Diagnoses and all orders for this visit:  Routine physical examination -     CBC with Differential/Platelet -     CMP14+EGFR -     Lipid panel -     TSH + free T4  GAD (generalized anxiety disorder) -     CBC with Differential/Platelet -     TSH + free T4 -     FLUoxetine  (PROZAC ) 20 MG tablet; Take 1 tablet (20 mg total) by mouth daily.  Binge eating -     lisdexamfetamine (VYVANSE) 10 MG capsule; Take 1 capsule (10 mg total) by mouth daily.  Teeth grinding -     baclofen  (LIORESAL ) 10 MG tablet; Take 1 tablet (10 mg total) by mouth at bedtime. TAKE 1 TABLET BY MOUTH EVERYDAY AT BEDTIME  Essential hypertension, benign -     CMP14+EGFR -     lisinopril  (ZESTRIL ) 20 MG tablet; Take 1 tablet (20 mg total) by mouth daily.  Screening for diabetes mellitus -     CMP14+EGFR  Screening for lipid disorders -     Lipid panel  Gastroesophageal reflux disease, unspecified whether esophagitis present   .Devin AasStart a regular exercise program and make sure you are eating a healthy diet Try to eat 4 servings of dairy a day or take a calcium supplement (500mg  twice a day). Your vaccines are up to date.  BP to goal refilled lisinopril  Fasting labs ordered PHQ not to goal, restart prozac , refilled today.  Refilled baclofen  for teeth grinding. Discussed binge eating, trial of vyvanse.  Discussed side effects.  Discussed GERD diet, ok for OTC prilosec as needed or daily. Follow up if symptoms not controlled.  Follow up in 3 months

## 2023-12-13 ENCOUNTER — Encounter: Payer: Self-pay | Admitting: Physician Assistant

## 2023-12-13 DIAGNOSIS — K219 Gastro-esophageal reflux disease without esophagitis: Secondary | ICD-10-CM | POA: Insufficient documentation

## 2023-12-15 ENCOUNTER — Ambulatory Visit: Payer: Self-pay | Admitting: Physician Assistant

## 2023-12-15 ENCOUNTER — Other Ambulatory Visit (HOSPITAL_COMMUNITY): Payer: Self-pay

## 2023-12-15 ENCOUNTER — Telehealth: Payer: Self-pay

## 2023-12-15 LAB — CBC WITH DIFFERENTIAL/PLATELET
Basophils Absolute: 0.1 10*3/uL (ref 0.0–0.2)
Basos: 1 %
EOS (ABSOLUTE): 0.2 10*3/uL (ref 0.0–0.4)
Eos: 2 %
Hematocrit: 43.6 % (ref 37.5–51.0)
Hemoglobin: 14.7 g/dL (ref 13.0–17.7)
Immature Grans (Abs): 0.1 10*3/uL (ref 0.0–0.1)
Immature Granulocytes: 1 %
Lymphocytes Absolute: 2.6 10*3/uL (ref 0.7–3.1)
Lymphs: 29 %
MCH: 31.2 pg (ref 26.6–33.0)
MCHC: 33.7 g/dL (ref 31.5–35.7)
MCV: 93 fL (ref 79–97)
Monocytes Absolute: 0.7 10*3/uL (ref 0.1–0.9)
Monocytes: 8 %
Neutrophils Absolute: 5.2 10*3/uL (ref 1.4–7.0)
Neutrophils: 59 %
Platelets: 211 10*3/uL (ref 150–450)
RBC: 4.71 x10E6/uL (ref 4.14–5.80)
RDW: 12.2 % (ref 11.6–15.4)
WBC: 8.8 10*3/uL (ref 3.4–10.8)

## 2023-12-15 LAB — CMP14+EGFR
ALT: 46 IU/L — ABNORMAL HIGH (ref 0–44)
AST: 33 IU/L (ref 0–40)
Albumin: 4.6 g/dL (ref 4.3–5.2)
Alkaline Phosphatase: 60 IU/L (ref 44–121)
BUN/Creatinine Ratio: 12 (ref 9–20)
BUN: 11 mg/dL (ref 6–20)
Bilirubin Total: 0.4 mg/dL (ref 0.0–1.2)
CO2: 24 mmol/L (ref 20–29)
Calcium: 9.8 mg/dL (ref 8.7–10.2)
Chloride: 103 mmol/L (ref 96–106)
Creatinine, Ser: 0.95 mg/dL (ref 0.76–1.27)
Globulin, Total: 2 g/dL (ref 1.5–4.5)
Glucose: 81 mg/dL (ref 70–99)
Potassium: 4.6 mmol/L (ref 3.5–5.2)
Sodium: 140 mmol/L (ref 134–144)
Total Protein: 6.6 g/dL (ref 6.0–8.5)
eGFR: 110 mL/min/{1.73_m2} (ref >59)

## 2023-12-15 LAB — LIPID PANEL
Chol/HDL Ratio: 5.1 ratio — ABNORMAL HIGH (ref 0.0–5.0)
Cholesterol, Total: 204 mg/dL — ABNORMAL HIGH (ref 100–199)
HDL: 40 mg/dL (ref >39)
LDL Chol Calc (NIH): 114 mg/dL — ABNORMAL HIGH (ref 0–99)
Triglycerides: 289 mg/dL — ABNORMAL HIGH (ref 0–149)
VLDL Cholesterol Cal: 50 mg/dL — ABNORMAL HIGH (ref 5–40)

## 2023-12-15 LAB — TSH+FREE T4
Free T4: 1.33 ng/dL (ref 0.82–1.77)
TSH: 2.27 u[IU]/mL (ref 0.450–4.500)

## 2023-12-15 NOTE — Progress Notes (Signed)
 Devin Kim,   TG improved some over 4 years ago and will continue with weight loss.  LDL just above optimal goal.   ALT, liver enzymes, a little elevated. Avoid tylenol  and alcohol and recheck in 2 weeks.   Thyroid  looks great.

## 2023-12-15 NOTE — Telephone Encounter (Signed)
 Pharmacy Patient Advocate Encounter   Received notification from CoverMyMeds that prior authorization for Lisdexamfetamine 10mg  caps is required/requested.   Insurance verification completed.   The patient is insured through [employer] .   Per test claim: PA required; PA submitted to above mentioned insurance via CoverMyMeds Key/confirmation #/EOC [id number] Status is pending

## 2023-12-18 ENCOUNTER — Other Ambulatory Visit (HOSPITAL_COMMUNITY): Payer: Self-pay

## 2023-12-18 NOTE — Telephone Encounter (Signed)
 Pharmacy Patient Advocate Encounter  Received notification from [facility] [employer] that Prior Authorization for Lisdexamfetamine 10mg  caps has been APPROVED from 11/15/23 to 12/14/24. Ran test claim, Copay is $264.04. This test claim was processed through [facility]- copay amounts may vary at other pharmacies due to pharmacy/plan contracts, or as the patient moves through the different stages of their insurance plan.   PA #/Case ID/Reference #: [id number]

## 2024-02-19 ENCOUNTER — Other Ambulatory Visit: Payer: Self-pay | Admitting: Physician Assistant

## 2024-02-19 DIAGNOSIS — R632 Polyphagia: Secondary | ICD-10-CM

## 2024-02-19 NOTE — Telephone Encounter (Unsigned)
 Copied from CRM [id number]. Topic: Clinical - Medication Refill >> Feb 19, 2024  3:53 PM Miquel SAILOR wrote: Medication: lisdexamfetamine (VYVANSE ) 10 MG capsule   Has the patient contacted their pharmacy? Yes (Agent: If no, request that the patient contact the pharmacy for the refill. If patient does not wish to contact the pharmacy document the reason why and proceed with request.) (Agent: If yes, when and what did the pharmacy advise?)  This is the patient's preferred pharmacy:  [id number] [place], [place] - [address] [place] [address] Phone: [phone] Fax: [phone]  Is this the correct pharmacy for this prescription? Yes If no, delete pharmacy and type the correct one.   Has the prescription been filled recently? Yes  Is the patient out of the medication? Yes  Has the patient been seen for an appointment in the last year OR does the patient have an upcoming appointment? Yes  Can we respond through MyChart? Yes  Agent: Please be advised that Rx refills may take up to 3 business days. We ask that you follow-up with your pharmacy.

## 2024-02-20 MED ORDER — LISDEXAMFETAMINE DIMESYLATE 10 MG PO CAPS: 10.0000 mg | ORAL_CAPSULE | Freq: Every day | ORAL | 0 refills | Status: DC

## 2024-02-20 NOTE — Telephone Encounter (Signed)
 Last filled 12/12/2023  Last OV 12/12/2023  Upcoming appointment 03/15/2024

## 2024-03-15 ENCOUNTER — Ambulatory Visit: Admitting: Physician Assistant

## 2024-03-22 ENCOUNTER — Telehealth (INDEPENDENT_AMBULATORY_CARE_PROVIDER_SITE_OTHER): Admitting: Physician Assistant

## 2024-03-22 ENCOUNTER — Encounter: Payer: Self-pay | Admitting: Physician Assistant

## 2024-03-22 DIAGNOSIS — I10 Essential (primary) hypertension: Secondary | ICD-10-CM | POA: Diagnosis not present

## 2024-03-22 DIAGNOSIS — R632 Polyphagia: Secondary | ICD-10-CM | POA: Diagnosis not present

## 2024-03-22 DIAGNOSIS — F411 Generalized anxiety disorder: Secondary | ICD-10-CM | POA: Diagnosis not present

## 2024-03-22 DIAGNOSIS — F331 Major depressive disorder, recurrent, moderate: Secondary | ICD-10-CM | POA: Diagnosis not present

## 2024-03-22 DIAGNOSIS — E039 Hypothyroidism, unspecified: Secondary | ICD-10-CM

## 2024-03-22 MED ORDER — LEVOTHYROXINE SODIUM 88 MCG PO TABS: 88.0000 ug | ORAL_TABLET | Freq: Every day | ORAL | 0 refills | Status: AC

## 2024-03-22 MED ORDER — LISDEXAMFETAMINE DIMESYLATE 10 MG PO CAPS
10.0000 mg | ORAL_CAPSULE | Freq: Every day | ORAL | 0 refills | Status: DC
Start: 2024-03-22 — End: 2024-03-29

## 2024-03-22 MED ORDER — LISDEXAMFETAMINE DIMESYLATE 10 MG PO CAPS: 10.0000 mg | ORAL_CAPSULE | Freq: Every day | ORAL | 0 refills | Status: DC

## 2024-03-22 MED ORDER — FLUOXETINE HCL 20 MG PO TABS: 40.0000 mg | ORAL_TABLET | Freq: Every day | ORAL | 0 refills | Status: AC

## 2024-03-22 NOTE — Progress Notes (Signed)
..  Virtual Visit via Video Note  I connected with Devin Kim on 03/24/24 at  2:00 PM EDT by a video enabled telemedicine application and verified that I am speaking with the correct person using two identifiers.  Location: Patient: home Provider: clinic  .SABRAParticipating in visit:  Patient: Devin Provider: Vermell Bologna PA-C   I discussed the limitations of evaluation and management by telemedicine and the availability of in person appointments. The patient expressed understanding and agreed to proceed.  History of Present Illness: Pt is a 28 yo male who calls into the clinic for refills.   He is doing well on thyroid  medication. Last labs 6/25 and normal. Needs refills.   He has been more anxious and stressed recently with life and work. He continues to take prozac  daily. No SI/HC. Binge eating better and with vyvanse  and when he has it overall he seems to do a little better. Cost for 2nd pick up was elevated and wants sent to another pharmacy.     Observations/Objective: No acute distress Normal breathing Normal mood and appearance   Assessment and Plan: SABRASABRADiagnoses and all orders for this visit:  GAD (generalized anxiety disorder) -     FLUoxetine  (PROZAC ) 20 MG tablet; Take 2 tablets (40 mg total) by mouth daily.  Binge eating -     lisdexamfetamine (VYVANSE ) 10 MG capsule; Take 1 capsule (10 mg total) by mouth daily. -     lisdexamfetamine (VYVANSE ) 10 MG capsule; Take 1 capsule (10 mg total) by mouth daily. -     lisdexamfetamine (VYVANSE ) 10 MG capsule; Take 1 capsule (10 mg total) by mouth daily.  Essential hypertension, benign  Moderate episode of recurrent major depressive disorder (HCC) -     FLUoxetine  (PROZAC ) 20 MG tablet; Take 2 tablets (40 mg total) by mouth daily.  Hypothyroidism, unspecified type -     levothyroxine  (SYNTHROID ) 88 MCG tablet; Take 1 tablet (88 mcg total) by mouth daily.   Prozac  increased to 40mg  daily. Continue on 10mg  of  vyvanse . Continue on levothyroxine  daily.  Follow up in 3 months.   Follow Up Instructions:    I discussed the assessment and treatment plan with the patient. The patient was provided an opportunity to ask questions and all were answered. The patient agreed with the plan and demonstrated an understanding of the instructions.   The patient was advised to call back or seek an in-person evaluation if the symptoms worsen or if the condition fails to improve as anticipated.   Laketa Sandoz, PA-C

## 2024-03-24 ENCOUNTER — Encounter: Payer: Self-pay | Admitting: Physician Assistant

## 2024-03-27 ENCOUNTER — Encounter: Payer: Self-pay | Admitting: Physician Assistant

## 2024-03-27 DIAGNOSIS — R632 Polyphagia: Secondary | ICD-10-CM

## 2024-03-27 NOTE — Telephone Encounter (Signed)
 Attempted to contact patient but unfortunately was sent to voicemail. I have left a voice message requesting a call back to our office for further clarification.

## 2024-03-27 NOTE — Telephone Encounter (Signed)
 Patient returned phone call and advised he is able to fill [facility]se  for a lower price, [facility]ill was sent to [facility]. I have contacted [facility] and have not been able to get through to the pharmacist to cancel prescription. Can we get a new prescription refill sent over to  [id number] - [place], [place] - [address] ?

## 2024-03-29 MED ORDER — LISDEXAMFETAMINE DIMESYLATE 10 MG PO CAPS: 10.0000 mg | ORAL_CAPSULE | Freq: Every day | ORAL | 0 refills | Status: AC

## 2024-03-29 NOTE — Addendum Note (Signed)
 Addended by: ANTONIETTE VERMELL CROME on: 03/29/2024 12:43 PM   Modules accepted: Orders

## 2024-03-29 NOTE — Telephone Encounter (Signed)
 Ok [facility]
